# Patient Record
Sex: Male | Born: 1950 | Race: White | Hispanic: No | Marital: Married | Smoking: Former smoker
Health system: Southern US, Community
[De-identification: ages and names within clinical notes are randomized; demographics above are authoritative.]

## PROBLEM LIST (undated history)

## (undated) DIAGNOSIS — F321 Major depressive disorder, single episode, moderate: Secondary | ICD-10-CM

## (undated) DIAGNOSIS — E785 Hyperlipidemia, unspecified: Secondary | ICD-10-CM

## (undated) DIAGNOSIS — F32A Depression, unspecified: Secondary | ICD-10-CM

## (undated) DIAGNOSIS — K219 Gastro-esophageal reflux disease without esophagitis: Secondary | ICD-10-CM

## (undated) DIAGNOSIS — F329 Major depressive disorder, single episode, unspecified: Secondary | ICD-10-CM

## (undated) HISTORY — DX: Hyperlipidemia, unspecified: E78.5

## (undated) HISTORY — DX: Depression, unspecified: F32.A

## (undated) HISTORY — PX: ANKLE SURGERY: SHX546

## (undated) HISTORY — DX: Major depressive disorder, single episode, moderate: F32.1

## (undated) HISTORY — DX: Gastro-esophageal reflux disease without esophagitis: K21.9

---
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
## 2015-07-05 DIAGNOSIS — M25562 Pain in left knee: Secondary | ICD-10-CM | POA: Diagnosis not present

## 2015-07-05 DIAGNOSIS — M1712 Unilateral primary osteoarthritis, left knee: Secondary | ICD-10-CM | POA: Diagnosis not present

## 2015-08-08 DIAGNOSIS — F331 Major depressive disorder, recurrent, moderate: Secondary | ICD-10-CM | POA: Diagnosis not present

## 2015-08-08 DIAGNOSIS — K219 Gastro-esophageal reflux disease without esophagitis: Secondary | ICD-10-CM | POA: Diagnosis not present

## 2015-08-08 DIAGNOSIS — E782 Mixed hyperlipidemia: Secondary | ICD-10-CM | POA: Diagnosis not present

## 2015-08-08 DIAGNOSIS — K210 Gastro-esophageal reflux disease with esophagitis: Secondary | ICD-10-CM | POA: Diagnosis not present

## 2015-08-08 DIAGNOSIS — Z23 Encounter for immunization: Secondary | ICD-10-CM | POA: Diagnosis not present

## 2015-12-14 DIAGNOSIS — M79672 Pain in left foot: Secondary | ICD-10-CM | POA: Diagnosis not present

## 2016-01-04 DIAGNOSIS — M21962 Unspecified acquired deformity of left lower leg: Secondary | ICD-10-CM | POA: Diagnosis not present

## 2016-01-04 DIAGNOSIS — M79672 Pain in left foot: Secondary | ICD-10-CM | POA: Diagnosis not present

## 2016-01-04 DIAGNOSIS — M19072 Primary osteoarthritis, left ankle and foot: Secondary | ICD-10-CM | POA: Diagnosis not present

## 2016-02-08 DIAGNOSIS — M79672 Pain in left foot: Secondary | ICD-10-CM | POA: Diagnosis not present

## 2016-02-08 DIAGNOSIS — Z0000 Encounter for general adult medical examination without abnormal findings: Secondary | ICD-10-CM | POA: Diagnosis not present

## 2016-02-08 DIAGNOSIS — M21962 Unspecified acquired deformity of left lower leg: Secondary | ICD-10-CM | POA: Diagnosis not present

## 2016-02-22 DIAGNOSIS — M19072 Primary osteoarthritis, left ankle and foot: Secondary | ICD-10-CM | POA: Diagnosis not present

## 2016-02-22 DIAGNOSIS — M25472 Effusion, left ankle: Secondary | ICD-10-CM | POA: Diagnosis not present

## 2016-02-22 DIAGNOSIS — M25572 Pain in left ankle and joints of left foot: Secondary | ICD-10-CM | POA: Diagnosis not present

## 2016-02-22 DIAGNOSIS — M21962 Unspecified acquired deformity of left lower leg: Secondary | ICD-10-CM | POA: Diagnosis not present

## 2016-02-27 DIAGNOSIS — M19072 Primary osteoarthritis, left ankle and foot: Secondary | ICD-10-CM | POA: Diagnosis not present

## 2016-02-29 DIAGNOSIS — F3342 Major depressive disorder, recurrent, in full remission: Secondary | ICD-10-CM | POA: Diagnosis not present

## 2016-02-29 DIAGNOSIS — K210 Gastro-esophageal reflux disease with esophagitis: Secondary | ICD-10-CM | POA: Diagnosis not present

## 2016-02-29 DIAGNOSIS — K219 Gastro-esophageal reflux disease without esophagitis: Secondary | ICD-10-CM | POA: Diagnosis not present

## 2016-02-29 DIAGNOSIS — E782 Mixed hyperlipidemia: Secondary | ICD-10-CM | POA: Diagnosis not present

## 2016-07-04 DIAGNOSIS — M25511 Pain in right shoulder: Secondary | ICD-10-CM | POA: Diagnosis not present

## 2016-07-11 DIAGNOSIS — M19032 Primary osteoarthritis, left wrist: Secondary | ICD-10-CM | POA: Diagnosis not present

## 2016-07-11 DIAGNOSIS — M25542 Pain in joints of left hand: Secondary | ICD-10-CM | POA: Diagnosis not present

## 2016-07-11 DIAGNOSIS — M19031 Primary osteoarthritis, right wrist: Secondary | ICD-10-CM | POA: Diagnosis not present

## 2016-07-11 DIAGNOSIS — M25541 Pain in joints of right hand: Secondary | ICD-10-CM | POA: Diagnosis not present

## 2016-07-16 DIAGNOSIS — H6122 Impacted cerumen, left ear: Secondary | ICD-10-CM | POA: Diagnosis not present

## 2016-08-08 DIAGNOSIS — M19031 Primary osteoarthritis, right wrist: Secondary | ICD-10-CM | POA: Diagnosis not present

## 2016-08-08 DIAGNOSIS — M19032 Primary osteoarthritis, left wrist: Secondary | ICD-10-CM | POA: Diagnosis not present

## 2016-08-31 DIAGNOSIS — Z79899 Other long term (current) drug therapy: Secondary | ICD-10-CM | POA: Diagnosis not present

## 2016-08-31 DIAGNOSIS — Z23 Encounter for immunization: Secondary | ICD-10-CM | POA: Diagnosis not present

## 2016-08-31 DIAGNOSIS — E782 Mixed hyperlipidemia: Secondary | ICD-10-CM | POA: Diagnosis not present

## 2016-08-31 DIAGNOSIS — K210 Gastro-esophageal reflux disease with esophagitis: Secondary | ICD-10-CM | POA: Diagnosis not present

## 2016-08-31 DIAGNOSIS — F3342 Major depressive disorder, recurrent, in full remission: Secondary | ICD-10-CM | POA: Diagnosis not present

## 2016-08-31 DIAGNOSIS — Z125 Encounter for screening for malignant neoplasm of prostate: Secondary | ICD-10-CM | POA: Diagnosis not present

## 2016-09-27 DIAGNOSIS — Z1211 Encounter for screening for malignant neoplasm of colon: Secondary | ICD-10-CM | POA: Diagnosis not present

## 2016-09-27 DIAGNOSIS — E782 Mixed hyperlipidemia: Secondary | ICD-10-CM | POA: Diagnosis not present

## 2016-09-27 DIAGNOSIS — Z0000 Encounter for general adult medical examination without abnormal findings: Secondary | ICD-10-CM | POA: Diagnosis not present

## 2016-09-27 DIAGNOSIS — Z6831 Body mass index (BMI) 31.0-31.9, adult: Secondary | ICD-10-CM | POA: Diagnosis not present

## 2016-12-21 DIAGNOSIS — H6123 Impacted cerumen, bilateral: Secondary | ICD-10-CM | POA: Diagnosis not present

## 2016-12-21 DIAGNOSIS — H903 Sensorineural hearing loss, bilateral: Secondary | ICD-10-CM | POA: Diagnosis not present

## 2016-12-27 DIAGNOSIS — R001 Bradycardia, unspecified: Secondary | ICD-10-CM | POA: Diagnosis not present

## 2017-02-05 DIAGNOSIS — M19031 Primary osteoarthritis, right wrist: Secondary | ICD-10-CM | POA: Diagnosis not present

## 2017-02-05 DIAGNOSIS — M19032 Primary osteoarthritis, left wrist: Secondary | ICD-10-CM | POA: Diagnosis not present

## 2017-02-13 DIAGNOSIS — R001 Bradycardia, unspecified: Secondary | ICD-10-CM | POA: Diagnosis not present

## 2017-03-28 DIAGNOSIS — E782 Mixed hyperlipidemia: Secondary | ICD-10-CM | POA: Diagnosis not present

## 2017-03-28 DIAGNOSIS — M19071 Primary osteoarthritis, right ankle and foot: Secondary | ICD-10-CM | POA: Diagnosis not present

## 2017-03-28 DIAGNOSIS — K210 Gastro-esophageal reflux disease with esophagitis: Secondary | ICD-10-CM | POA: Diagnosis not present

## 2017-03-28 DIAGNOSIS — F3342 Major depressive disorder, recurrent, in full remission: Secondary | ICD-10-CM | POA: Diagnosis not present

## 2017-04-30 DIAGNOSIS — D6489 Other specified anemias: Secondary | ICD-10-CM | POA: Diagnosis not present

## 2017-04-30 DIAGNOSIS — R5383 Other fatigue: Secondary | ICD-10-CM | POA: Diagnosis not present

## 2017-05-14 ENCOUNTER — Other Ambulatory Visit: Payer: Self-pay

## 2017-06-25 DIAGNOSIS — H6502 Acute serous otitis media, left ear: Secondary | ICD-10-CM | POA: Diagnosis not present

## 2017-06-25 DIAGNOSIS — J060 Acute laryngopharyngitis: Secondary | ICD-10-CM | POA: Diagnosis not present

## 2017-09-23 DIAGNOSIS — K210 Gastro-esophageal reflux disease with esophagitis: Secondary | ICD-10-CM | POA: Diagnosis not present

## 2017-09-23 DIAGNOSIS — E782 Mixed hyperlipidemia: Secondary | ICD-10-CM | POA: Diagnosis not present

## 2017-09-23 DIAGNOSIS — Z23 Encounter for immunization: Secondary | ICD-10-CM | POA: Diagnosis not present

## 2017-09-23 DIAGNOSIS — F3342 Major depressive disorder, recurrent, in full remission: Secondary | ICD-10-CM | POA: Diagnosis not present

## 2018-02-13 DIAGNOSIS — L578 Other skin changes due to chronic exposure to nonionizing radiation: Secondary | ICD-10-CM | POA: Diagnosis not present

## 2018-02-13 DIAGNOSIS — D485 Neoplasm of uncertain behavior of skin: Secondary | ICD-10-CM | POA: Diagnosis not present

## 2018-02-13 DIAGNOSIS — L918 Other hypertrophic disorders of the skin: Secondary | ICD-10-CM | POA: Diagnosis not present

## 2018-02-13 DIAGNOSIS — L821 Other seborrheic keratosis: Secondary | ICD-10-CM | POA: Diagnosis not present

## 2018-03-24 DIAGNOSIS — Z1211 Encounter for screening for malignant neoplasm of colon: Secondary | ICD-10-CM | POA: Diagnosis not present

## 2018-03-24 DIAGNOSIS — F3342 Major depressive disorder, recurrent, in full remission: Secondary | ICD-10-CM | POA: Diagnosis not present

## 2018-03-24 DIAGNOSIS — Z6831 Body mass index (BMI) 31.0-31.9, adult: Secondary | ICD-10-CM | POA: Diagnosis not present

## 2018-03-24 DIAGNOSIS — K210 Gastro-esophageal reflux disease with esophagitis: Secondary | ICD-10-CM | POA: Diagnosis not present

## 2018-03-24 DIAGNOSIS — E782 Mixed hyperlipidemia: Secondary | ICD-10-CM | POA: Diagnosis not present

## 2018-03-31 DIAGNOSIS — Z1211 Encounter for screening for malignant neoplasm of colon: Secondary | ICD-10-CM | POA: Diagnosis not present

## 2018-03-31 LAB — COLOGUARD: Cologuard: NEGATIVE

## 2018-08-04 DIAGNOSIS — Z23 Encounter for immunization: Secondary | ICD-10-CM | POA: Diagnosis not present

## 2018-09-23 DIAGNOSIS — K210 Gastro-esophageal reflux disease with esophagitis: Secondary | ICD-10-CM | POA: Diagnosis not present

## 2018-09-23 DIAGNOSIS — E782 Mixed hyperlipidemia: Secondary | ICD-10-CM | POA: Diagnosis not present

## 2018-09-23 DIAGNOSIS — F321 Major depressive disorder, single episode, moderate: Secondary | ICD-10-CM | POA: Diagnosis not present

## 2019-04-28 DIAGNOSIS — Z0000 Encounter for general adult medical examination without abnormal findings: Secondary | ICD-10-CM | POA: Diagnosis not present

## 2019-04-28 DIAGNOSIS — F321 Major depressive disorder, single episode, moderate: Secondary | ICD-10-CM | POA: Diagnosis not present

## 2019-04-28 DIAGNOSIS — Z125 Encounter for screening for malignant neoplasm of prostate: Secondary | ICD-10-CM | POA: Diagnosis not present

## 2019-04-28 DIAGNOSIS — Z6832 Body mass index (BMI) 32.0-32.9, adult: Secondary | ICD-10-CM | POA: Diagnosis not present

## 2019-04-28 DIAGNOSIS — E782 Mixed hyperlipidemia: Secondary | ICD-10-CM | POA: Diagnosis not present

## 2019-04-28 DIAGNOSIS — K210 Gastro-esophageal reflux disease with esophagitis: Secondary | ICD-10-CM | POA: Diagnosis not present

## 2019-04-28 DIAGNOSIS — Z23 Encounter for immunization: Secondary | ICD-10-CM | POA: Diagnosis not present

## 2019-08-03 DIAGNOSIS — M25511 Pain in right shoulder: Secondary | ICD-10-CM | POA: Diagnosis not present

## 2019-08-03 DIAGNOSIS — M25512 Pain in left shoulder: Secondary | ICD-10-CM | POA: Diagnosis not present

## 2019-10-03 DIAGNOSIS — M791 Myalgia, unspecified site: Secondary | ICD-10-CM | POA: Diagnosis not present

## 2019-10-03 DIAGNOSIS — R0981 Nasal congestion: Secondary | ICD-10-CM | POA: Diagnosis not present

## 2019-10-03 DIAGNOSIS — Z20828 Contact with and (suspected) exposure to other viral communicable diseases: Secondary | ICD-10-CM | POA: Diagnosis not present

## 2019-12-31 ENCOUNTER — Encounter: Payer: Self-pay | Admitting: Physician Assistant

## 2019-12-31 ENCOUNTER — Other Ambulatory Visit: Payer: Self-pay

## 2019-12-31 ENCOUNTER — Ambulatory Visit (INDEPENDENT_AMBULATORY_CARE_PROVIDER_SITE_OTHER): Payer: Medicare Other | Admitting: Physician Assistant

## 2019-12-31 VITALS — BP 128/78 | HR 84 | Temp 97.3°F | Resp 16 | Wt 240.0 lb

## 2019-12-31 DIAGNOSIS — F419 Anxiety disorder, unspecified: Secondary | ICD-10-CM

## 2019-12-31 DIAGNOSIS — M25511 Pain in right shoulder: Secondary | ICD-10-CM

## 2019-12-31 DIAGNOSIS — M25512 Pain in left shoulder: Secondary | ICD-10-CM

## 2019-12-31 DIAGNOSIS — E782 Mixed hyperlipidemia: Secondary | ICD-10-CM | POA: Diagnosis not present

## 2019-12-31 DIAGNOSIS — G8929 Other chronic pain: Secondary | ICD-10-CM

## 2019-12-31 DIAGNOSIS — K219 Gastro-esophageal reflux disease without esophagitis: Secondary | ICD-10-CM | POA: Diagnosis not present

## 2019-12-31 MED ORDER — ATORVASTATIN CALCIUM 40 MG PO TABS: 40.0000 mg | ORAL_TABLET | Freq: Every day | ORAL | 1 refills | Status: DC

## 2019-12-31 MED ORDER — MELOXICAM 7.5 MG PO TABS: 7.5000 mg | ORAL_TABLET | Freq: Every day | ORAL | 1 refills | Status: DC

## 2019-12-31 MED ORDER — OMEPRAZOLE 40 MG PO CPDR: 40.0000 mg | DELAYED_RELEASE_CAPSULE | Freq: Every day | ORAL | 1 refills | Status: DC

## 2019-12-31 MED ORDER — PREDNISONE 20 MG PO TABS: ORAL_TABLET | ORAL | 0 refills | Status: DC

## 2019-12-31 MED ORDER — SERTRALINE HCL 100 MG PO TABS: 100.0000 mg | ORAL_TABLET | Freq: Every day | ORAL | 1 refills | Status: DC

## 2019-12-31 NOTE — Progress Notes (Signed)
Established Patient Office Visit  Subjective:  Patient ID: Angel Orr, male    DOB: [birth year]  Age: 69 y.o. MRN: [id number]  CC:  Chief Complaint  Patient presents with  . Hyperlipidemia  . Depression    HPI Angel Orr presents for hyperlipidemia and chronic problems  Mixed hyperlipidemia  Pt presents with hyperlipidemia.  Compliance with treatment has been good; The patient is compliant with medications, maintains a low cholesterol diet , follows up as directed , and maintains an exercise regimen . The patient denies experiencing any hypercholesterolemia related symptoms.  Evidenced based information based on history , exam, and other sources has been used  for decision making. Pt taking lipitor '40mg'$  qd  Pt with history of mild depression and anxiety - is currently on zoloft '100mg'$  qd - states med working well for him  GERD: Paitent complains of heartburn. States he is taking omeprazole '40mg'$  qd and this controls his symptoms - voices no other concerns with stomach  Pt states that he has a history of chronic shoulder pain - every once in awhile he gets to a point where he has tightness in his shoulders and not able to extend arms over head - when his symptoms get to this point he takes prednisone along with his meloxicam and it improves symptoms - requests today for prednisone     Past Medical History:  Diagnosis Date  . Chronic GERD   . Depression   . Hyperlipemia     Past Surgical History:  Procedure Laterality Date  . ANKLE SURGERY      Family History  Problem Relation Age of Onset  . CVA Mother   . CAD Father     Social History   Socioeconomic History  . Marital status: Married    Spouse name: Not on file  . Number of children: Not on file  . Years of education: Not on file  . Highest education level: Not on file  Occupational History  . Occupation: truck Geophysicist/field seismologist  Tobacco Use  . Smoking status: Never Smoker  . Smokeless tobacco: Never Used   Substance and Sexual Activity  . Alcohol use: Never  . Drug use: Never  . Sexual activity: Not on file  Other Topics Concern  . Not on file  Social History Narrative  . Not on file   Social Determinants of Health   Financial Resource Strain:   . Difficulty of Paying Living Expenses:   Food Insecurity:   . Worried About Charity fundraiser in the Last Year:   . Arboriculturist in the Last Year:   Transportation Needs:   . Film/video editor (Medical):   Marland Kitchen Lack of Transportation (Non-Medical):   Physical Activity:   . Days of Exercise per Week:   . Minutes of Exercise per Session:   Stress:   . Feeling of Stress :   Social Connections:   . Frequency of Communication with Friends and Family:   . Frequency of Social Gatherings with Friends and Family:   . Attends Religious Services:   . Active Member of Clubs or Organizations:   . Attends Archivist Meetings:   Marland Kitchen Marital Status:   Intimate Partner Violence:   . Fear of Current or Ex-Partner:   . Emotionally Abused:   Marland Kitchen Physically Abused:   . Sexually Abused:      Current Outpatient Medications:  .  aspirin 81 MG EC tablet, Take by mouth., Disp: , Rfl:  .  atorvastatin (LIPITOR) 40 MG tablet, Take 1 tablet (40 mg total) by mouth daily., Disp: 90 tablet, Rfl: 1 .  meloxicam (MOBIC) 7.5 MG tablet, Take 1 tablet (7.5 mg total) by mouth daily., Disp: 90 tablet, Rfl: 1 .  Multiple Vitamins-Minerals (MULTI COMPLETE PO), Take by mouth., Disp: , Rfl:  .  Omega-3 Fatty Acids (FISH OIL) 1000 MG CAPS, Take by mouth., Disp: , Rfl:  .  omeprazole (PRILOSEC) 40 MG capsule, Take 1 capsule (40 mg total) by mouth daily., Disp: 90 capsule, Rfl: 1 .  predniSONE (DELTASONE) 20 MG tablet, 1 po tid for 3 days 1 po bid for 3 days 1 po qd for 3 days, Disp: 18 tablet, Rfl: 0 .  sertraline (ZOLOFT) 100 MG tablet, Take 1 tablet (100 mg total) by mouth daily., Disp: 90 tablet, Rfl: 1   Allergies  Allergen Reactions  . Codeine   .  Morphine     ROS CONSTITUTIONAL: Negative for chills, fatigue, fever, unintentional weight gain and unintentional weight loss.  E/N/T: Negative for ear pain, nasal congestion and sore throat.  CARDIOVASCULAR: Negative for chest pain, dizziness, palpitations and pedal edema.  RESPIRATORY: Negative for recent cough and dyspnea.  GASTROINTESTINAL: Negative for abdominal pain, acid reflux symptoms, constipation, diarrhea, nausea and vomiting.  MSK: see HPI INTEGUMENTARY: Negative for rash.  NEUROLOGICAL: Negative for dizziness and headaches.  PSYCHIATRIC: Negative for sleep disturbance and to question depression screen.  Negative for depression, negative for anhedonia.        Objective:    PHYSICAL EXAM:   VS: BP 128/78   Pulse 84   Temp (!) 97.3 F (36.3 C)   Resp 16   Wt 240 lb (108.9 kg)   SpO2 96%   GEN: Well nourished, well developed, in no acute distress   Cardiac: RRR; no murmurs, rubs, or gallops,no edema - no significant varicosities Respiratory:  normal respiratory rate and pattern with no distress - normal breath sounds with no rales, rhonchi, wheezes or rubs GI: normal bowel sounds, no masses or tenderness MS: no deformity or atrophy - limited rom of both arms - decreased extension Skin: warm and dry, no rash  Neuro:  Alert and Oriented x 3, Strength and sensation are intact - CN II-Xii grossly intact Psych: euthymic mood, appropriate affect and demeanor  BP 128/78   Pulse 84   Temp (!) 97.3 F (36.3 C)   Resp 16   Wt 240 lb (108.9 kg)   SpO2 96%  Wt Readings from Last 3 Encounters:  12/31/19 240 lb (108.9 kg)     Health Maintenance Due  Topic Date Due  . Hepatitis C Screening  Never done  . TETANUS/TDAP  Never done  . PNA vac Low Risk Adult (1 of 2 - PCV13) Never done    There are no preventive care reminders to display for this patient.  No results found for: TSH No results found for: WBC, HGB, HCT, MCV, PLT No results found for: NA, K, CHLORIDE,  CO2, GLUCOSE, BUN, CREATININE, BILITOT, ALKPHOS, AST, ALT, PROT, ALBUMIN, CALCIUM, ANIONGAP, EGFR, GFR No results found for: CHOL No results found for: HDL No results found for: LDLCALC No results found for: TRIG No results found for: CHOLHDL No results found for: HGBA1C    Assessment & Plan:   Problem List Items Addressed This Visit      Digestive   Gastroesophageal reflux disease without esophagitis - Primary    Continue omeprazole as directed  Relevant Medications   omeprazole (PRILOSEC) 40 MG capsule   Other Relevant Orders   CBC with Differential/Platelet   Comprehensive metabolic panel     Other   Anxiety    Continue zoloft as directed      Relevant Medications   sertraline (ZOLOFT) 100 MG tablet   Mixed hyperlipidemia    Well controlled.  No changes to medicines.  Continue to work on eating a healthy diet and exercise.  Labs drawn today.        Relevant Medications   aspirin 81 MG EC tablet   atorvastatin (LIPITOR) 40 MG tablet   Other Relevant Orders   CBC with Differential/Platelet   Comprehensive metabolic panel   Lipid panel   Shoulder pain, bilateral    rx for prednisone         Meds ordered this encounter  Medications  . atorvastatin (LIPITOR) 40 MG tablet    Sig: Take 1 tablet (40 mg total) by mouth daily.    Dispense:  90 tablet    Refill:  1    Order Specific Question:   Supervising Provider    AnswerRochel Brome [id number]  . meloxicam (MOBIC) 7.5 MG tablet    Sig: Take 1 tablet (7.5 mg total) by mouth daily.    Dispense:  90 tablet    Refill:  1    Order Specific Question:   Supervising Provider    AnswerRochel Brome [id number]  . omeprazole (PRILOSEC) 40 MG capsule    Sig: Take 1 capsule (40 mg total) by mouth daily.    Dispense:  90 capsule    Refill:  1    Order Specific Question:   Supervising Provider    AnswerRochel Brome [id number]  . sertraline (ZOLOFT) 100 MG tablet    Sig: Take 1 tablet (100 mg total) by  mouth daily.    Dispense:  90 tablet    Refill:  1    Order Specific Question:   Supervising Provider    AnswerRochel Brome [id number]  . predniSONE (DELTASONE) 20 MG tablet    Sig: 1 po tid for 3 days 1 po bid for 3 days 1 po qd for 3 days    Dispense:  18 tablet    Refill:  0    Order Specific Question:   Supervising Provider    AnswerShelton Silvas    Follow-up: Return in about 6 months (around 07/02/2020) for fasting physical.    SARA R Haniyah Maciolek, PA-C

## 2019-12-31 NOTE — Assessment & Plan Note (Signed)
Continue zoloft as directed

## 2019-12-31 NOTE — Assessment & Plan Note (Signed)
Well controlled.  ?No changes to medicines.  ?Continue to work on eating a healthy diet and exercise.  ?Labs drawn today.  ?

## 2019-12-31 NOTE — Assessment & Plan Note (Signed)
Continue omeprazole as directed

## 2019-12-31 NOTE — Assessment & Plan Note (Signed)
rx for prednisone

## 2020-01-01 LAB — CBC WITH DIFFERENTIAL/PLATELET
Basophils Absolute: 0.1 10*3/uL (ref 0.0–0.2)
Basos: 1 %
EOS (ABSOLUTE): 0.3 10*3/uL (ref 0.0–0.4)
Eos: 5 %
Hematocrit: 38 % (ref 37.5–51.0)
Hemoglobin: 12.7 g/dL — ABNORMAL LOW (ref 13.0–17.7)
Immature Grans (Abs): 0 10*3/uL (ref 0.0–0.1)
Immature Granulocytes: 0 %
Lymphocytes Absolute: 1.9 10*3/uL (ref 0.7–3.1)
Lymphs: 35 %
MCH: 29.5 pg (ref 26.6–33.0)
MCHC: 33.4 g/dL (ref 31.5–35.7)
MCV: 88 fL (ref 79–97)
Monocytes Absolute: 0.5 10*3/uL (ref 0.1–0.9)
Monocytes: 10 %
Neutrophils Absolute: 2.7 10*3/uL (ref 1.4–7.0)
Neutrophils: 49 %
Platelets: 210 10*3/uL (ref 150–450)
RBC: 4.31 x10E6/uL (ref 4.14–5.80)
RDW: 13.2 % (ref 11.6–15.4)
WBC: 5.4 10*3/uL (ref 3.4–10.8)

## 2020-01-01 LAB — COMPREHENSIVE METABOLIC PANEL
ALT: 20 IU/L (ref 0–44)
AST: 19 IU/L (ref 0–40)
Albumin/Globulin Ratio: 1.5 (ref 1.2–2.2)
Albumin: 4 g/dL (ref 3.8–4.8)
Alkaline Phosphatase: 62 IU/L (ref 39–117)
BUN/Creatinine Ratio: 18 (ref 10–24)
BUN: 18 mg/dL (ref 8–27)
Bilirubin Total: 0.2 mg/dL (ref 0.0–1.2)
CO2: 20 mmol/L (ref 20–29)
Calcium: 9.1 mg/dL (ref 8.6–10.2)
Chloride: 107 mmol/L — ABNORMAL HIGH (ref 96–106)
Creatinine, Ser: 1 mg/dL (ref 0.76–1.27)
GFR calc Af Amer: 88 mL/min/{1.73_m2} (ref >59)
GFR calc non Af Amer: 76 mL/min/{1.73_m2} (ref >59)
Globulin, Total: 2.6 g/dL (ref 1.5–4.5)
Glucose: 95 mg/dL (ref 65–99)
Potassium: 4.5 mmol/L (ref 3.5–5.2)
Sodium: 143 mmol/L (ref 134–144)
Total Protein: 6.6 g/dL (ref 6.0–8.5)

## 2020-01-01 LAB — LIPID PANEL
Chol/HDL Ratio: 3 ratio (ref 0.0–5.0)
Cholesterol, Total: 203 mg/dL — ABNORMAL HIGH (ref 100–199)
HDL: 67 mg/dL (ref >39)
LDL Chol Calc (NIH): 114 mg/dL — ABNORMAL HIGH (ref 0–99)
Triglycerides: 124 mg/dL (ref 0–149)
VLDL Cholesterol Cal: 22 mg/dL (ref 5–40)

## 2020-01-01 LAB — CARDIOVASCULAR RISK ASSESSMENT

## 2020-01-25 ENCOUNTER — Ambulatory Visit (INDEPENDENT_AMBULATORY_CARE_PROVIDER_SITE_OTHER): Payer: Medicare Other | Admitting: Physician Assistant

## 2020-01-25 ENCOUNTER — Other Ambulatory Visit: Payer: Self-pay

## 2020-01-25 ENCOUNTER — Encounter: Payer: Self-pay | Admitting: Physician Assistant

## 2020-01-25 VITALS — BP 130/84 | HR 68 | Temp 97.5°F | Ht 71.0 in | Wt 246.0 lb

## 2020-01-25 DIAGNOSIS — F321 Major depressive disorder, single episode, moderate: Secondary | ICD-10-CM | POA: Insufficient documentation

## 2020-01-25 DIAGNOSIS — R040 Epistaxis: Secondary | ICD-10-CM | POA: Insufficient documentation

## 2020-01-25 MED ORDER — MUPIROCIN 2 % EX OINT: 1.0000 "application " | TOPICAL_OINTMENT | Freq: Two times a day (BID) | CUTANEOUS | 0 refills | Status: DC

## 2020-01-25 NOTE — Progress Notes (Signed)
Acute Office Visit  Subjective:    Patient ID: Angel Orr, male    DOB: 1950/12/16, 69 y.o.   MRN: [id number]  Chief Complaint  Patient presents with  . Nose Bleeds    HPI Patient is in today for nosebleed Pt states that he has a nosebleed from his left nostril about once every 3-4 months which has been going on 'awhile' Today he had one that lasted about 10 minutes - states they do get worse if he picks at his nose which he tries not to do  Past Medical History:  Diagnosis Date  . Chronic GERD   . Depression   . Hyperlipemia   . Major depressive disorder, single episode, moderate ([place])     Past Surgical History:  Procedure Laterality Date  . ANKLE SURGERY      Family History  Problem Relation Age of Onset  . CVA Mother   . CAD Mother   . CAD Father   . Cancer Sister   . CAD Sister     Social History   Socioeconomic History  . Marital status: Married    Spouse name: Not on file  . Number of children: Not on file  . Years of education: Not on file  . Highest education level: Not on file  Occupational History  . Occupation: truck Geophysicist/field seismologist  Tobacco Use  . Smoking status: Never Smoker  . Smokeless tobacco: Never Used  Substance and Sexual Activity  . Alcohol use: Never  . Drug use: Never  . Sexual activity: Not on file  Other Topics Concern  . Not on file  Social History Narrative  . Not on file   Social Determinants of Health   Financial Resource Strain:   . Difficulty of Paying Living Expenses:   Food Insecurity:   . Worried About Charity fundraiser in the Last Year:   . Arboriculturist in the Last Year:   Transportation Needs:   . Film/video editor (Medical):   Marland Kitchen Lack of Transportation (Non-Medical):   Physical Activity:   . Days of Exercise per Week:   . Minutes of Exercise per Session:   Stress:   . Feeling of Stress :   Social Connections:   . Frequency of Communication with Friends and Family:   . Frequency of Social Gatherings  with Friends and Family:   . Attends Religious Services:   . Active Member of Clubs or Organizations:   . Attends Archivist Meetings:   Marland Kitchen Marital Status:   Intimate Partner Violence:   . Fear of Current or Ex-Partner:   . Emotionally Abused:   Marland Kitchen Physically Abused:   . Sexually Abused:      Current Outpatient Medications:  .  aspirin 81 MG EC tablet, Take by mouth., Disp: , Rfl:  .  atorvastatin (LIPITOR) 40 MG tablet, Take 1 tablet (40 mg total) by mouth daily., Disp: 90 tablet, Rfl: 1 .  meloxicam (MOBIC) 7.5 MG tablet, Take 1 tablet (7.5 mg total) by mouth daily., Disp: 90 tablet, Rfl: 1 .  Multiple Vitamins-Minerals (MULTI COMPLETE PO), Take by mouth., Disp: , Rfl:  .  Omega-3 Fatty Acids (FISH OIL) 1000 MG CAPS, Take by mouth., Disp: , Rfl:  .  omeprazole (PRILOSEC) 40 MG capsule, Take 1 capsule (40 mg total) by mouth daily., Disp: 90 capsule, Rfl: 1 .  sertraline (ZOLOFT) 100 MG tablet, Take 1 tablet (100 mg total) by mouth daily., Disp: 90 tablet, Rfl:  1 .  mupirocin ointment (BACTROBAN) 2 %, Place 1 application into the nose 2 (two) times daily., Disp: 22 g, Rfl: 0   Allergies  Allergen Reactions  . Aleve [Naproxen]   . Codeine   . Morphine     CONSTITUTIONAL: Negative for chills, fatigue, fever, unintentional weight gain and unintentional weight loss.  E/N/T: Negative for ear pain, nasal congestion and sore throat. - see HPI CARDIOVASCULAR: Negative for chest pain, dizziness, palpitations and pedal edema.  RESPIRATORY: Negative for recent cough and dyspnea.          Objective:    PHYSICAL EXAM:   VS: BP 130/84   Pulse 68   Temp (!) 97.5 F (36.4 C)   Ht 5\' 11"  (1.803 m)   Wt 246 lb (111.6 kg)   SpO2 96%   BMI 34.31 kg/m   GEN: Well nourished, well developed, in no acute distress  HEENT: right nares normal - left nares with ulcerated area mid septum --- silver nitrate used to cauterize Pharynx normal  Cardiac: RRR; no murmurs, rubs, or  gallops,no edema - no significant varicosities Respiratory:  normal respiratory rate and pattern with no distress - normal breath sounds with no rales, rhonchi, wheezes or rubs  Psych: euthymic mood, appropriate affect and demeanor   Wt Readings from Last 3 Encounters:  01/25/20 246 lb (111.6 kg)  12/31/19 240 lb (108.9 kg)    Health Maintenance Due  Topic Date Due  . Hepatitis C Screening  Never done  . TETANUS/TDAP  Never done  . PNA vac Low Risk Adult (1 of 2 - PCV13) Never done    There are no preventive care reminders to display for this patient.        Assessment & Plan:   Problem List Items Addressed This Visit    None       Meds ordered this encounter  Medications  . mupirocin ointment (BACTROBAN) 2 %    Sig: Place 1 application into the nose 2 (two) times daily.    Dispense:  22 g    Refill:  0    Order Specific Question:   Supervising Provider    Answer:   COX, KIRSTEN [id number]     [place], PA-C

## 2020-01-25 NOTE — Assessment & Plan Note (Signed)
bactroban bid for one week Saline spray Follow up to recheck in one month

## 2020-02-24 ENCOUNTER — Ambulatory Visit (INDEPENDENT_AMBULATORY_CARE_PROVIDER_SITE_OTHER): Payer: Medicare Other | Admitting: Physician Assistant

## 2020-02-24 ENCOUNTER — Encounter: Payer: Self-pay | Admitting: Physician Assistant

## 2020-02-24 ENCOUNTER — Other Ambulatory Visit: Payer: Self-pay

## 2020-02-24 VITALS — BP 132/74 | HR 86 | Temp 97.7°F | Resp 16 | Wt 240.0 lb

## 2020-02-24 DIAGNOSIS — R040 Epistaxis: Secondary | ICD-10-CM

## 2020-02-24 NOTE — Progress Notes (Signed)
Acute Office Visit  Subjective:    Patient ID: Angel Orr, male    DOB: Mar 16, 1951, 69 y.o.   MRN: [id number]  Chief Complaint  Patient presents with  . Follow up epistaxis  .     HPI Patient is in today for nosebleed follow up Pt states that he has a nosebleed from his left nostril about once every 3-4 months which has been going on 'awhile' one month ago he came in and had an area treated with silver nitrate - is here for recheck States he has not had any further nosebleeds since that time  Past Medical History:  Diagnosis Date  . Chronic GERD   . Depression   . Hyperlipemia   . Major depressive disorder, single episode, moderate ([place])     Past Surgical History:  Procedure Laterality Date  . ANKLE SURGERY      Family History  Problem Relation Age of Onset  . CVA Mother   . CAD Mother   . CAD Father   . Cancer Sister   . CAD Sister     Social History   Socioeconomic History  . Marital status: Married    Spouse name: Not on file  . Number of children: Not on file  . Years of education: Not on file  . Highest education level: Not on file  Occupational History  . Occupation: truck Geophysicist/field seismologist  Tobacco Use  . Smoking status: Never Smoker  . Smokeless tobacco: Never Used  Substance and Sexual Activity  . Alcohol use: Never  . Drug use: Never  . Sexual activity: Not on file  Other Topics Concern  . Not on file  Social History Narrative  . Not on file   Social Determinants of Health   Financial Resource Strain:   . Difficulty of Paying Living Expenses:   Food Insecurity:   . Worried About Charity fundraiser in the Last Year:   . Arboriculturist in the Last Year:   Transportation Needs:   . Film/video editor (Medical):   Marland Kitchen Lack of Transportation (Non-Medical):   Physical Activity:   . Days of Exercise per Week:   . Minutes of Exercise per Session:   Stress:   . Feeling of Stress :   Social Connections:   . Frequency of Communication with  Friends and Family:   . Frequency of Social Gatherings with Friends and Family:   . Attends Religious Services:   . Active Member of Clubs or Organizations:   . Attends Archivist Meetings:   Marland Kitchen Marital Status:   Intimate Partner Violence:   . Fear of Current or Ex-Partner:   . Emotionally Abused:   Marland Kitchen Physically Abused:   . Sexually Abused:      Current Outpatient Medications:  .  aspirin 81 MG EC tablet, Take by mouth., Disp: , Rfl:  .  atorvastatin (LIPITOR) 40 MG tablet, Take 1 tablet (40 mg total) by mouth daily., Disp: 90 tablet, Rfl: 1 .  meloxicam (MOBIC) 7.5 MG tablet, Take 1 tablet (7.5 mg total) by mouth daily., Disp: 90 tablet, Rfl: 1 .  Multiple Vitamins-Minerals (MULTI COMPLETE PO), Take by mouth., Disp: , Rfl:  .  mupirocin ointment (BACTROBAN) 2 %, Place 1 application into the nose 2 (two) times daily., Disp: 22 g, Rfl: 0 .  Omega-3 Fatty Acids (FISH OIL) 1000 MG CAPS, Take by mouth., Disp: , Rfl:  .  omeprazole (PRILOSEC) 40 MG capsule, Take 1 capsule (  40 mg total) by mouth daily., Disp: 90 capsule, Rfl: 1 .  sertraline (ZOLOFT) 100 MG tablet, Take 1 tablet (100 mg total) by mouth daily., Disp: 90 tablet, Rfl: 1   Allergies  Allergen Reactions  . Aleve [Naproxen]   . Codeine   . Morphine     CONSTITUTIONAL: Negative for chills, fatigue, fever, unintentional weight gain and unintentional weight loss.  E/N/T: Negative for ear pain, nasal congestion and sore throat. -  CARDIOVASCULAR: Negative for chest pain, dizziness, palpitations and pedal edema.  RESPIRATORY: Negative for recent cough and dyspnea.          Objective:    PHYSICAL EXAM:   VS: BP 132/74   Pulse 86   Temp 97.7 F (36.5 C)   Resp 16   Wt 240 lb (108.9 kg)   SpO2 96%   BMI 33.47 kg/m   GEN: Well nourished, well developed, in no acute distress  HEENT: both nares normal - area has healed completely from before - no lesions noted in either nostril Pharynx normal  Cardiac: RRR;  no murmurs, rubs, or gallops,no edema - no significant varicosities Respiratory:  normal respiratory rate and pattern with no distress - normal breath sounds with no rales, rhonchi, wheezes or rubs     Wt Readings from Last 3 Encounters:  02/24/20 240 lb (108.9 kg)  01/25/20 246 lb (111.6 kg)  12/31/19 240 lb (108.9 kg)    Health Maintenance Due  Topic Date Due  . Hepatitis C Screening  Never done  . COVID-19 Vaccine (1) Never done  . TETANUS/TDAP  Never done  . PNA vac Low Risk Adult (1 of 2 - PCV13) Never done    There are no preventive care reminders to display for this patient.        Assessment & Plan:   Problem List Items Addressed This Visit      Other   Epistaxis - Primary       No orders of the defined types were placed in this encounter.    SARA R Reedy Biernat, PA-C

## 2020-02-24 NOTE — Assessment & Plan Note (Signed)
Continue to use saline as needed Follow up if symptoms recur

## 2020-06-28 ENCOUNTER — Other Ambulatory Visit: Payer: Self-pay | Admitting: Physician Assistant

## 2020-07-04 ENCOUNTER — Other Ambulatory Visit: Payer: Self-pay

## 2020-07-04 ENCOUNTER — Ambulatory Visit (INDEPENDENT_AMBULATORY_CARE_PROVIDER_SITE_OTHER): Payer: Medicare Other | Admitting: Physician Assistant

## 2020-07-04 ENCOUNTER — Encounter: Payer: Self-pay | Admitting: Physician Assistant

## 2020-07-04 VITALS — BP 130/76 | HR 81 | Temp 97.5°F | Ht 71.0 in | Wt 235.8 lb

## 2020-07-04 DIAGNOSIS — Z87898 Personal history of other specified conditions: Secondary | ICD-10-CM | POA: Diagnosis not present

## 2020-07-04 DIAGNOSIS — Z0000 Encounter for general adult medical examination without abnormal findings: Secondary | ICD-10-CM

## 2020-07-04 DIAGNOSIS — R3912 Poor urinary stream: Secondary | ICD-10-CM | POA: Diagnosis not present

## 2020-07-04 DIAGNOSIS — E782 Mixed hyperlipidemia: Secondary | ICD-10-CM | POA: Diagnosis not present

## 2020-07-04 DIAGNOSIS — Z125 Encounter for screening for malignant neoplasm of prostate: Secondary | ICD-10-CM | POA: Diagnosis not present

## 2020-07-04 MED ORDER — OMEPRAZOLE 40 MG PO CPDR: 40.0000 mg | DELAYED_RELEASE_CAPSULE | Freq: Every day | ORAL | 1 refills | Status: DC

## 2020-07-04 MED ORDER — MELOXICAM 7.5 MG PO TABS: 7.5000 mg | ORAL_TABLET | Freq: Every day | ORAL | 1 refills | Status: DC

## 2020-07-04 MED ORDER — ATORVASTATIN CALCIUM 40 MG PO TABS: 40.0000 mg | ORAL_TABLET | Freq: Every day | ORAL | 1 refills | Status: DC

## 2020-07-04 MED ORDER — SERTRALINE HCL 100 MG PO TABS: 100.0000 mg | ORAL_TABLET | Freq: Every day | ORAL | 1 refills | Status: DC

## 2020-07-04 NOTE — Assessment & Plan Note (Signed)
PSA pending

## 2020-07-04 NOTE — Assessment & Plan Note (Signed)
Well controlled.  ?No changes to medicines.  ?Continue to work on eating a healthy diet and exercise.  ?Labs drawn today.  ?

## 2020-07-04 NOTE — Patient Instructions (Signed)
Preventive Care 70 Years and Older, Male Preventive care refers to lifestyle choices and visits with your health care provider that can promote health and wellness. This includes:  A yearly physical exam. This is also called an annual well check.  Regular dental and eye exams.  Immunizations.  Screening for certain conditions.  Healthy lifestyle choices, such as diet and exercise. What can I expect for my preventive care visit? Physical exam Your health care provider will check:  Height and weight. These may be used to calculate body mass index (BMI), which is a measurement that tells if you are at a healthy weight.  Heart rate and blood pressure.  Your skin for abnormal spots. Counseling Your health care provider may ask you questions about:  Alcohol, tobacco, and drug use.  Emotional well-being.  Home and relationship well-being.  Sexual activity.  Eating habits.  History of falls.  Memory and ability to understand (cognition).  Work and work environment. What immunizations do I need?  Influenza (flu) vaccine  This is recommended every year. Tetanus, diphtheria, and pertussis (Tdap) vaccine  You may need a Td booster every 10 years. Varicella (chickenpox) vaccine  You may need this vaccine if you have not already been vaccinated. Zoster (shingles) vaccine  You may need this after age 70. Pneumococcal conjugate (PCV13) vaccine  One dose is recommended after age 70. Pneumococcal polysaccharide (PPSV23) vaccine  One dose is recommended after age 70. Measles, mumps, and rubella (MMR) vaccine  You may need at least one dose of MMR if you were born in 1957 or later. You may also need a second dose. Meningococcal conjugate (MenACWY) vaccine  You may need this if you have certain conditions. Hepatitis A vaccine  You may need this if you have certain conditions or if you travel or work in places where you may be exposed to hepatitis A. Hepatitis B  vaccine  You may need this if you have certain conditions or if you travel or work in places where you may be exposed to hepatitis B. Haemophilus influenzae type b (Hib) vaccine  You may need this if you have certain conditions. You may receive vaccines as individual doses or as more than one vaccine together in one shot (combination vaccines). Talk with your health care provider about the risks and benefits of combination vaccines. What tests do I need? Blood tests  Lipid and cholesterol levels. These may be checked every 5 years, or more frequently depending on your overall health.  Hepatitis C test.  Hepatitis B test. Screening  Lung cancer screening. You may have this screening every year starting at age 70 if you have a 30-pack-year history of smoking and currently smoke or have quit within the past 15 years.  Colorectal cancer screening. All adults should have this screening starting at age 70 and continuing until age 75. Your health care provider may recommend screening at age 70 if you are at increased risk. You will have tests every 1-10 years, depending on your results and the type of screening test.  Prostate cancer screening. Recommendations will vary depending on your family history and other risks.  Diabetes screening. This is done by checking your blood sugar (glucose) after you have not eaten for a while (fasting). You may have this done every 1-3 years.  Abdominal aortic aneurysm (AAA) screening. You may need this if you are a current or former smoker.  Sexually transmitted disease (STD) testing. Follow these instructions at home: Eating and drinking  Eat   a diet that includes fresh fruits and vegetables, whole grains, lean protein, and low-fat dairy products. Limit your intake of foods with high amounts of sugar, saturated fats, and salt.  Take vitamin and mineral supplements as recommended by your health care provider.  Do not drink alcohol if your health care  provider tells you not to drink.  If you drink alcohol: ? Limit how much you have to 0-2 drinks a day. ? Be aware of how much alcohol is in your drink. In the [place] one drink equals one 12 oz bottle of beer (355 mL), one 5 oz glass of wine (148 mL), or one 1 oz glass of hard liquor (44 mL). Lifestyle  Take daily care of your teeth and gums.  Stay active. Exercise for at least 30 minutes on 5 or more days each week.  Do not use any products that contain nicotine or tobacco, such as cigarettes, e-cigarettes, and chewing tobacco. If you need help quitting, ask your health care provider.  If you are sexually active, practice safe sex. Use a condom or other form of protection to prevent STIs (sexually transmitted infections).  Talk with your health care provider about taking a low-dose aspirin or statin. What's next?  Visit your health care provider once a year for a well check visit.  Ask your health care provider how often you should have your eyes and teeth checked.  Stay up to date on all vaccines. This information is not intended to replace advice given to you by your health care provider. Make sure you discuss any questions you have with your health care provider. Document Revised: 10/02/2018 Document Reviewed: 10/02/2018 Elsevier Patient Education  2020 [employer].

## 2020-07-04 NOTE — Assessment & Plan Note (Signed)
Continue healthy lifestyle Wellness handout given

## 2020-07-04 NOTE — Progress Notes (Signed)
Subjective:  Patient ID: Angel Orr, male    DOB: 06/13/1951  Age: 69 y.o. MRN: [id number]  Chief Complaint  Patient presents with   Annual Exam    Medicare AWV    HPI Encounter for general adult medical examination without abnormal findings  Physical ("At Risk" items are starred): Patient's last physical exam was 1 year ago .   SDOH Screenings   Alcohol Screen:    Last Alcohol Screening Score (AUDIT): Not on file  Depression (PHQ2-9): Low Risk    PHQ-2 Score: 0  Financial Resource Strain:    Difficulty of Paying Living Expenses: Not on file  Food Insecurity:    Worried About Charity fundraiser in the Last Year: Not on file   Ran Out of Food in the Last Year: Not on file  Housing:    Last Housing Risk Score: Not on file  Physical Activity:    Days of Exercise per Week: Not on file   Minutes of Exercise per Session: Not on file  Social Connections:    Frequency of Communication with Friends and Family: Not on file   Frequency of Social Gatherings with Friends and Family: Not on file   Attends Religious Services: Not on file   Active Member of Clubs or Organizations: Not on file   Attends Archivist Meetings: Not on file   Marital Status: Not on file  Stress:    Feeling of Stress : Not on file  Tobacco Use: Low Risk    Smoking Tobacco Use: Never Smoker   Smokeless Tobacco Use: Never Used  Transportation Needs:    Film/video editor (Medical): Not on file   Lack of Transportation (Non-Medical): Not on file    Fall Risk  07/04/2020 05/14/2017  Falls in the past year? 0 No  Comment - Emmi Telephone Survey: data to providers prior to load  Number falls in past yr: 0 -  Injury with Fall? 0 -    Depression screen PHQ 2/9 07/04/2020  Decreased Interest 0  Down, Depressed, Hopeless 0  PHQ - 2 Score 0    Functional Status Survey: Is the patient deaf or have difficulty hearing?: No Does the patient have difficulty seeing, even when  wearing glasses/contacts?: No Does the patient have difficulty concentrating, remembering, or making decisions?: No Does the patient have difficulty walking or climbing stairs?: No Does the patient have difficulty dressing or bathing?: No Does the patient have difficulty doing errands alone such as visiting a doctor's office or shopping?: No  Dental Care: biannual cleanings, brushes and flosses daily. Ophthalmology/Optometry: Annual visit.  Hearing loss: none Vision impairments: none Safety: reviewed ;  Patient wears a seat belt. Patient's home has smoke detectors and carbon monoxide detectors. Patient practices appropriate gun safety Patient wears sunscreen with extended sun exposure. Dental Care: biannual cleanings, brushes and flosses daily. Ophthalmology/Optometry: Annual visit.  Hearing loss: hearing aids Vision impairments: none  Patient is not afflicted from Stress Incontinence and Urge Incontinence - occasional nocturia  Current Outpatient Medications on File Prior to Visit  Medication Sig Dispense Refill   aspirin 81 MG EC tablet Take by mouth.     Multiple Vitamins-Minerals (MULTI COMPLETE PO) Take by mouth.     Omega-3 Fatty Acids (FISH OIL) 1000 MG CAPS Take by mouth.     No current facility-administered medications on file prior to visit.    Social Hx   Social History   Socioeconomic History   Marital status: Married  Spouse name: Not on file   Number of children: Not on file   Years of education: Not on file   Highest education level: Not on file  Occupational History   Occupation: truck driver  Tobacco Use   Smoking status: Never Smoker   Smokeless tobacco: Never Used  Scientific laboratory technician Use: Never used  Substance and Sexual Activity   Alcohol use: Never   Drug use: Never   Sexual activity: Not on file  Other Topics Concern   Not on file  Social History Narrative   Not on file   Social Determinants of Health   Financial  Resource Strain:    Difficulty of Paying Living Expenses: Not on file  Food Insecurity:    Worried About [place] in the Last Year: Not on file   [place] in the Last Year: Not on file  Transportation Needs:    Lack of Transportation (Medical): Not on file   Lack of Transportation (Non-Medical): Not on file  Physical Activity:    Days of Exercise per Week: Not on file   Minutes of Exercise per Session: Not on file  Stress:    Feeling of Stress : Not on file  Social Connections:    Frequency of Communication with Friends and Family: Not on file   Frequency of Social Gatherings with Friends and Family: Not on file   Attends Religious Services: Not on file   Active Member of Clubs or Organizations: Not on file   Attends Archivist Meetings: Not on file   Marital Status: Not on file   Past Medical History:  Diagnosis Date   Chronic GERD    Depression    Hyperlipemia    Major depressive disorder, single episode, moderate ([place])    Family History  Problem Relation Age of Onset   CVA Mother    CAD Mother    CAD Father    Cancer Sister    CAD Sister     Review of Systems  CONSTITUTIONAL: Negative for chills, fatigue, fever, unintentional weight gain and unintentional weight loss.  E/N/T: Negative for ear pain, nasal congestion and sore throat.  CARDIOVASCULAR: Negative for chest pain, dizziness, palpitations and pedal edema.  RESPIRATORY: Negative for recent cough and dyspnea.  GASTROINTESTINAL: Negative for abdominal pain, acid reflux symptoms, constipation, diarrhea, nausea and vomiting.  MSK: Negative for arthralgias and myalgias.  INTEGUMENTARY: Negative for rash.  NEUROLOGICAL: Negative for dizziness and headaches.  PSYCHIATRIC: Negative for sleep disturbance and to question depression screen.  Negative for depression, negative for anhedonia.      Objective:  BP 130/76 (BP Location: Left Arm, Patient Position: Sitting)     Pulse 81    Temp (!) 97.5 F (36.4 C) (Temporal)    Ht 5\' 11"  (1.803 m)    Wt 235 lb 12.8 oz (107 kg)    SpO2 97%    BMI 32.89 kg/m   BP/Weight 07/04/2020 02/22/6643 0/12/4740  Systolic BP 595 638 756  Diastolic BP 76 74 84  Wt. (Lbs) 235.8 240 246  BMI 32.89 33.47 34.31    Physical Exam PHYSICAL EXAM:   VS: BP 130/76 (BP Location: Left Arm, Patient Position: Sitting)    Pulse 81    Temp (!) 97.5 F (36.4 C) (Temporal)    Ht 5\' 11"  (1.803 m)    Wt 235 lb 12.8 oz (107 kg)    SpO2 97%    BMI 32.89 kg/m  GEN: Well nourished, well developed, in no acute distress  Cardiac: RRR; no murmurs, rubs, or gallops,no edema - no significant varicosities Respiratory:  normal respiratory rate and pattern with no distress - normal breath sounds with no rales, rhonchi, wheezes or rubs  Psych: euthymic mood, appropriate affect and demeanor  Lab Results  Component Value Date   WBC 5.4 12/31/2019   HGB 12.7 (L) 12/31/2019   HCT 38.0 12/31/2019   PLT 210 12/31/2019   GLUCOSE 95 12/31/2019   CHOL 203 (H) 12/31/2019   TRIG 124 12/31/2019   HDL 67 12/31/2019   LDLCALC 114 (H) 12/31/2019   ALT 20 12/31/2019   AST 19 12/31/2019   NA 143 12/31/2019   K 4.5 12/31/2019   CL 107 (H) 12/31/2019   CREATININE 1.00 12/31/2019   BUN 18 12/31/2019   CO2 20 12/31/2019      Assessment & Plan:  1. Medicare annual wellness visit, subsequent  2. Mixed hyperlipidemia - CBC with Differential/Platelet - Comprehensive metabolic panel - Lipid panel  3. Prostate cancer screening - PSA  4. History of nocturia - PSA    Meds ordered this encounter  Medications   atorvastatin (LIPITOR) 40 MG tablet    Sig: Take 1 tablet (40 mg total) by mouth daily.    Dispense:  90 tablet    Refill:  1    Order Specific Question:   Supervising Provider    Answer:   Shelton Silvas   meloxicam (MOBIC) 7.5 MG tablet    Sig: Take 1 tablet (7.5 mg total) by mouth daily.    Dispense:  90 tablet    Refill:  1      Order Specific Question:   Supervising Provider    Answer:   Shelton Silvas   omeprazole (PRILOSEC) 40 MG capsule    Sig: Take 1 capsule (40 mg total) by mouth daily.    Dispense:  90 capsule    Refill:  1    Order Specific Question:   Supervising Provider    Answer:   Shelton Silvas   sertraline (ZOLOFT) 100 MG tablet    Sig: Take 1 tablet (100 mg total) by mouth daily.    Dispense:  90 tablet    Refill:  1    Order Specific Question:   Supervising Provider    AnswerShelton Silvas    These are the goals we discussed: Goals   None      This is a list of the screening recommended for you and due dates:  Health Maintenance  Topic Date Due    Hepatitis C: One time screening is recommended by Center for Disease Control  (CDC) for  adults born from [birth year] through 1966.   Never done   COVID-19 Vaccine (1) Never done   Tetanus Vaccine  Never done   Pneumonia vaccines (1 of 2 - PCV13) Never done   Flu Shot  05/22/2020   Cologuard (Stool DNA test)  03/31/2021      AN INDIVIDUALIZED CARE PLAN: was established or reinforced today.   SELF MANAGEMENT: The patient and I together assessed ways to personally work towards obtaining the recommended goals  Support needs The patient and/or family needs were assessed and services were offered and not necessary at this time.    Follow-up: Return in about 6 months (around 01/01/2021) for chronic fasting follow up. [place] [phone]

## 2020-07-05 LAB — CBC WITH DIFFERENTIAL/PLATELET
Basophils Absolute: 0.1 10*3/uL (ref 0.0–0.2)
Basos: 1 %
EOS (ABSOLUTE): 0.3 10*3/uL (ref 0.0–0.4)
Eos: 5 %
Hematocrit: 35.9 % — ABNORMAL LOW (ref 37.5–51.0)
Hemoglobin: 11.9 g/dL — ABNORMAL LOW (ref 13.0–17.7)
Immature Grans (Abs): 0 10*3/uL (ref 0.0–0.1)
Immature Granulocytes: 0 %
Lymphocytes Absolute: 1.7 10*3/uL (ref 0.7–3.1)
Lymphs: 28 %
MCH: 29.2 pg (ref 26.6–33.0)
MCHC: 33.1 g/dL (ref 31.5–35.7)
MCV: 88 fL (ref 79–97)
Monocytes Absolute: 0.6 10*3/uL (ref 0.1–0.9)
Monocytes: 10 %
Neutrophils Absolute: 3.3 10*3/uL (ref 1.4–7.0)
Neutrophils: 56 %
Platelets: 238 10*3/uL (ref 150–450)
RBC: 4.08 x10E6/uL — ABNORMAL LOW (ref 4.14–5.80)
RDW: 13.3 % (ref 11.6–15.4)
WBC: 5.9 10*3/uL (ref 3.4–10.8)

## 2020-07-05 LAB — COMPREHENSIVE METABOLIC PANEL
ALT: 19 IU/L (ref 0–44)
AST: 15 IU/L (ref 0–40)
Albumin/Globulin Ratio: 1.8 (ref 1.2–2.2)
Albumin: 4.2 g/dL (ref 3.8–4.8)
Alkaline Phosphatase: 78 IU/L (ref 44–121)
BUN/Creatinine Ratio: 18 (ref 10–24)
BUN: 17 mg/dL (ref 8–27)
Bilirubin Total: <0.2 mg/dL (ref 0.0–1.2)
CO2: 20 mmol/L (ref 20–29)
Calcium: 9.1 mg/dL (ref 8.6–10.2)
Chloride: 107 mmol/L — ABNORMAL HIGH (ref 96–106)
Creatinine, Ser: 0.94 mg/dL (ref 0.76–1.27)
GFR calc Af Amer: 95 mL/min/{1.73_m2} (ref >59)
GFR calc non Af Amer: 82 mL/min/{1.73_m2} (ref >59)
Globulin, Total: 2.4 g/dL (ref 1.5–4.5)
Glucose: 83 mg/dL (ref 65–99)
Potassium: 4.5 mmol/L (ref 3.5–5.2)
Sodium: 143 mmol/L (ref 134–144)
Total Protein: 6.6 g/dL (ref 6.0–8.5)

## 2020-07-05 LAB — LIPID PANEL
Chol/HDL Ratio: 3.3 ratio (ref 0.0–5.0)
Cholesterol, Total: 201 mg/dL — ABNORMAL HIGH (ref 100–199)
HDL: 61 mg/dL (ref >39)
LDL Chol Calc (NIH): 99 mg/dL (ref 0–99)
Triglycerides: 241 mg/dL — ABNORMAL HIGH (ref 0–149)
VLDL Cholesterol Cal: 41 mg/dL — ABNORMAL HIGH (ref 5–40)

## 2020-07-05 LAB — CARDIOVASCULAR RISK ASSESSMENT

## 2020-07-05 LAB — PSA: Prostate Specific Ag, Serum: <0.1 ng/mL (ref 0.0–4.0)

## 2020-08-02 ENCOUNTER — Other Ambulatory Visit: Payer: Medicare Other

## 2020-08-02 ENCOUNTER — Other Ambulatory Visit: Payer: Self-pay

## 2020-08-02 DIAGNOSIS — D508 Other iron deficiency anemias: Secondary | ICD-10-CM | POA: Diagnosis not present

## 2020-08-02 LAB — CBC
Hematocrit: 36 % — ABNORMAL LOW (ref 37.5–51.0)
Hemoglobin: 11.7 g/dL — ABNORMAL LOW (ref 13.0–17.7)
MCH: 28.6 pg (ref 26.6–33.0)
MCHC: 32.5 g/dL (ref 31.5–35.7)
MCV: 88 fL (ref 79–97)
Platelets: 238 10*3/uL (ref 150–450)
RBC: 4.09 x10E6/uL — ABNORMAL LOW (ref 4.14–5.80)
RDW: 13.1 % (ref 11.6–15.4)
WBC: 6.1 10*3/uL (ref 3.4–10.8)

## 2020-10-05 ENCOUNTER — Encounter: Payer: Self-pay | Admitting: Physician Assistant

## 2020-10-05 ENCOUNTER — Other Ambulatory Visit: Payer: Self-pay

## 2020-10-05 ENCOUNTER — Ambulatory Visit (INDEPENDENT_AMBULATORY_CARE_PROVIDER_SITE_OTHER): Payer: Medicare Other | Admitting: Physician Assistant

## 2020-10-05 VITALS — BP 126/72 | HR 76 | Temp 97.2°F | Ht 71.0 in | Wt 238.0 lb

## 2020-10-05 DIAGNOSIS — K148 Other diseases of tongue: Secondary | ICD-10-CM

## 2020-10-05 NOTE — Progress Notes (Signed)
Acute Office Visit  Subjective:    Patient ID: Angel Orr, male    DOB: [birth year], 69 y.o.   MRN: [id number]  Chief Complaint  Patient presents with  . Spot on tongue    HPI Patient is in today for lesion on tongue - pt states that he noticed it about 2 months ago - describes as raised white patch - does not bother him - does have a history of cigarettes but quit in 2003 but now does dip tobacco regularly  Past Medical History:  Diagnosis Date  . Chronic GERD   . Depression   . Hyperlipemia   . Major depressive disorder, single episode, moderate ([place])     Past Surgical History:  Procedure Laterality Date  . ANKLE SURGERY      Family History  Problem Relation Age of Onset  . CVA Mother   . CAD Mother   . CAD Father   . Cancer Sister   . CAD Sister     Social History   Socioeconomic History  . Marital status: Married    Spouse name: Not on file  . Number of children: Not on file  . Years of education: Not on file  . Highest education level: Not on file  Occupational History  . Occupation: truck Geophysicist/field seismologist  Tobacco Use  . Smoking status: Never Smoker  . Smokeless tobacco: Never Used  Vaping Use  . Vaping Use: Never used  Substance and Sexual Activity  . Alcohol use: Never  . Drug use: Never  . Sexual activity: Not on file  Other Topics Concern  . Not on file  Social History Narrative  . Not on file   Social Determinants of Health   Financial Resource Strain: Not on file  Food Insecurity: Not on file  Transportation Needs: Not on file  Physical Activity: Not on file  Stress: Not on file  Social Connections: Not on file  Intimate Partner Violence: Not on file    Outpatient Medications Prior to Visit  Medication Sig Dispense Refill  . aspirin 81 MG EC tablet Take by mouth.    Marland Kitchen atorvastatin (LIPITOR) 40 MG tablet Take 1 tablet (40 mg total) by mouth daily. 90 tablet 1  . meloxicam (MOBIC) 7.5 MG tablet Take 1 tablet (7.5 mg total) by mouth  daily. 90 tablet 1  . Multiple Vitamins-Minerals (MULTI COMPLETE PO) Take by mouth.    . Omega-3 Fatty Acids (FISH OIL) 1000 MG CAPS Take by mouth.    Marland Kitchen omeprazole (PRILOSEC) 40 MG capsule Take 1 capsule (40 mg total) by mouth daily. 90 capsule 1  . sertraline (ZOLOFT) 100 MG tablet Take 1 tablet (100 mg total) by mouth daily. 90 tablet 1   No facility-administered medications prior to visit.    Allergies  Allergen Reactions  . Aleve [Naproxen]   . Codeine   . Morphine     Review of Systems CONSTITUTIONAL: Negative for chills, fatigue, fever, unintentional weight gain and unintentional weight loss.  E/N/T: see HPI CARDIOVASCULAR: Negative for chest pain, dizziness, palpitations and pedal edema.  RESPIRATORY: Negative for recent cough and dyspnea.         Objective:    Physical Exam PHYSICAL EXAM:   VS: BP 126/72 (BP Location: Right Arm, Patient Position: Sitting, Cuff Size: Large)   Pulse 76   Temp (!) 97.2 F (36.2 C) (Temporal)   Ht 5\' 11"  (1.803 m)   Wt 238 lb (108 kg)   SpO2 99%  BMI 33.19 kg/m   GEN: Well nourished, well developed, in no acute distress  Oropharynx - no erythema - tongue has raised white plaque on right side Cardiac: RRR; no murmurs, rubs, or gallops,no edema -  Respiratory:  normal respiratory rate and pattern with no distress - normal breath sounds with no rales, rhonchi, wheezes or rubs   BP 126/72 (BP Location: Right Arm, Patient Position: Sitting, Cuff Size: Large)   Pulse 76   Temp (!) 97.2 F (36.2 C) (Temporal)   Ht 5\' 11"  (1.803 m)   Wt 238 lb (108 kg)   SpO2 99%   BMI 33.19 kg/m  Wt Readings from Last 3 Encounters:  10/05/20 238 lb (108 kg)  07/04/20 235 lb 12.8 oz (107 kg)  02/24/20 240 lb (108.9 kg)    There are no preventive care reminders to display for this patient.  There are no preventive care reminders to display for this patient.   No results found for: TSH Lab Results  Component Value Date   WBC 6.1  08/02/2020   HGB 11.7 (L) 08/02/2020   HCT 36.0 (L) 08/02/2020   MCV 88 08/02/2020   PLT 238 08/02/2020   Lab Results  Component Value Date   NA 143 07/04/2020   K 4.5 07/04/2020   CO2 20 07/04/2020   GLUCOSE 83 07/04/2020   BUN 17 07/04/2020   CREATININE 0.94 07/04/2020   BILITOT <0.2 07/04/2020   ALKPHOS 78 07/04/2020   AST 15 07/04/2020   ALT 19 07/04/2020   PROT 6.6 07/04/2020   ALBUMIN 4.2 07/04/2020   CALCIUM 9.1 07/04/2020   Lab Results  Component Value Date   CHOL 201 (H) 07/04/2020   Lab Results  Component Value Date   HDL 61 07/04/2020   Lab Results  Component Value Date   LDLCALC 99 07/04/2020   Lab Results  Component Value Date   TRIG 241 (H) 07/04/2020   Lab Results  Component Value Date   CHOLHDL 3.3 07/04/2020   No results found for: HGBA1C     Assessment & Plan:  1. Tongue lesion - Ambulatory referral to ENT    No orders of the defined types were placed in this encounter.   Orders Placed This Encounter  Procedures  . Ambulatory referral to ENT       Follow-up: No follow-ups on file.  An After Visit Summary was printed and given to the patient.  Yetta Flock [facility] [phone]

## 2020-11-14 DIAGNOSIS — Z974 Presence of external hearing-aid: Secondary | ICD-10-CM | POA: Diagnosis not present

## 2020-11-14 DIAGNOSIS — J342 Deviated nasal septum: Secondary | ICD-10-CM | POA: Diagnosis not present

## 2020-11-14 DIAGNOSIS — K089 Disorder of teeth and supporting structures, unspecified: Secondary | ICD-10-CM | POA: Diagnosis not present

## 2020-11-14 DIAGNOSIS — J310 Chronic rhinitis: Secondary | ICD-10-CM | POA: Diagnosis not present

## 2020-11-14 DIAGNOSIS — Z720 Tobacco use: Secondary | ICD-10-CM | POA: Diagnosis not present

## 2020-11-14 DIAGNOSIS — J3489 Other specified disorders of nose and nasal sinuses: Secondary | ICD-10-CM | POA: Diagnosis not present

## 2020-11-14 DIAGNOSIS — H9193 Unspecified hearing loss, bilateral: Secondary | ICD-10-CM | POA: Diagnosis not present

## 2020-11-14 DIAGNOSIS — K1321 Leukoplakia of oral mucosa, including tongue: Secondary | ICD-10-CM | POA: Diagnosis not present

## 2020-11-23 ENCOUNTER — Encounter: Payer: Self-pay | Admitting: Physician Assistant

## 2021-01-09 ENCOUNTER — Ambulatory Visit (INDEPENDENT_AMBULATORY_CARE_PROVIDER_SITE_OTHER): Payer: Medicare Other | Admitting: Physician Assistant

## 2021-01-09 ENCOUNTER — Other Ambulatory Visit: Payer: Self-pay

## 2021-01-09 ENCOUNTER — Encounter: Payer: Self-pay | Admitting: Physician Assistant

## 2021-01-09 VITALS — BP 122/76 | HR 75 | Temp 96.4°F | Ht 71.0 in | Wt 241.6 lb

## 2021-01-09 DIAGNOSIS — K219 Gastro-esophageal reflux disease without esophagitis: Secondary | ICD-10-CM | POA: Diagnosis not present

## 2021-01-09 DIAGNOSIS — F419 Anxiety disorder, unspecified: Secondary | ICD-10-CM

## 2021-01-09 DIAGNOSIS — Z1211 Encounter for screening for malignant neoplasm of colon: Secondary | ICD-10-CM

## 2021-01-09 DIAGNOSIS — E782 Mixed hyperlipidemia: Secondary | ICD-10-CM

## 2021-01-09 MED ORDER — SERTRALINE HCL 100 MG PO TABS: 100.0000 mg | ORAL_TABLET | Freq: Every day | ORAL | 1 refills | Status: DC

## 2021-01-09 MED ORDER — MELOXICAM 7.5 MG PO TABS: 7.5000 mg | ORAL_TABLET | Freq: Every day | ORAL | 1 refills | Status: DC

## 2021-01-09 MED ORDER — ATORVASTATIN CALCIUM 40 MG PO TABS
40.0000 mg | ORAL_TABLET | Freq: Every day | ORAL | 1 refills | Status: DC
Start: 2021-01-09 — End: 2021-07-05

## 2021-01-09 MED ORDER — OMEPRAZOLE 40 MG PO CPDR
40.0000 mg | DELAYED_RELEASE_CAPSULE | Freq: Every day | ORAL | 1 refills | Status: DC
Start: 2021-01-09 — End: 2021-07-05

## 2021-01-09 NOTE — Progress Notes (Signed)
Established Patient Office Visit  Subjective:  Patient ID: Angel Orr, male    DOB: January 18, 1951  Age: 70 y.o. MRN: [id number]  CC:  Chief Complaint  Patient presents with  . Hyperlipidemia  . Depression    HPI Catcher Dehoyos presents for hyperlipidemia and chronic problems  Mixed hyperlipidemia  Pt presents with hyperlipidemia.  Compliance with treatment has been good; The patient is compliant with medications, maintains a low cholesterol diet , follows up as directed , . The patient denies experiencing any hypercholesterolemia related symptoms.  Pt taking lipitor 40mg  qd  Pt with history of mild depression and anxiety - is currently on zoloft 100mg  qd - states med working well for him  GERD: Paitent complains of heartburn. States he is taking omeprazole 40mg  qd and this controls his symptoms - voices no other concerns with stomach  Pt takes meloxicam only as needed for intermittent knee pain - voices no other concerns today    Past Medical History:  Diagnosis Date  . Chronic GERD   . Depression   . Hyperlipemia   . Major depressive disorder, single episode, moderate ([place])     Past Surgical History:  Procedure Laterality Date  . ANKLE SURGERY      Family History  Problem Relation Age of Onset  . CVA Mother   . CAD Mother   . CAD Father   . Cancer Sister   . CAD Sister     Social History   Socioeconomic History  . Marital status: Married    Spouse name: Not on file  . Number of children: Not on file  . Years of education: Not on file  . Highest education level: Not on file  Occupational History  . Occupation: truck Geophysicist/field seismologist  Tobacco Use  . Smoking status: Never Smoker  . Smokeless tobacco: Never Used  Vaping Use  . Vaping Use: Never used  Substance and Sexual Activity  . Alcohol use: Never  . Drug use: Never  . Sexual activity: Not on file  Other Topics Concern  . Not on file  Social History Narrative  . Not on file   Social Determinants of  Health   Financial Resource Strain: Not on file  Food Insecurity: Not on file  Transportation Needs: Not on file  Physical Activity: Not on file  Stress: Not on file  Social Connections: Not on file  Intimate Partner Violence: Not on file     Current Outpatient Medications:  .  aspirin 81 MG EC tablet, Take by mouth., Disp: , Rfl:  .  Multiple Vitamins-Minerals (MULTI COMPLETE PO), Take by mouth., Disp: , Rfl:  .  Omega-3 Fatty Acids (FISH OIL) 1000 MG CAPS, Take by mouth., Disp: , Rfl:  .  atorvastatin (LIPITOR) 40 MG tablet, Take 1 tablet (40 mg total) by mouth daily., Disp: 90 tablet, Rfl: 1 .  meloxicam (MOBIC) 7.5 MG tablet, Take 1 tablet (7.5 mg total) by mouth daily., Disp: 90 tablet, Rfl: 1 .  omeprazole (PRILOSEC) 40 MG capsule, Take 1 capsule (40 mg total) by mouth daily., Disp: 90 capsule, Rfl: 1 .  sertraline (ZOLOFT) 100 MG tablet, Take 1 tablet (100 mg total) by mouth daily., Disp: 90 tablet, Rfl: 1   Allergies  Allergen Reactions  . Aleve [Naproxen]   . Codeine   . Morphine     ROS CONSTITUTIONAL: Negative for chills, fatigue, fever, unintentional weight gain and unintentional weight loss.  E/N/T: Negative for ear pain, nasal congestion and sore throat.  CARDIOVASCULAR: Negative for chest pain, dizziness, palpitations and pedal edema.  RESPIRATORY: Negative for recent cough and dyspnea.  GASTROINTESTINAL: Negative for abdominal pain, acid reflux symptoms, constipation, diarrhea, nausea and vomiting.  MSK: Negative for arthralgias and myalgias.  INTEGUMENTARY: Negative for rash.  NEUROLOGICAL: Negative for dizziness and headaches.  PSYCHIATRIC: Negative for sleep disturbance and to question depression screen.  Negative for depression, negative for anhedonia.            Objective:    PHYSICAL EXAM:   VS: BP 122/76 (BP Location: Left Arm, Patient Position: Sitting, Cuff Size: Normal)   Pulse 75   Temp (!) 96.4 F (35.8 C) (Temporal)   Ht 5\' 11"  (1.803  m)   Wt 241 lb 9.6 oz (109.6 kg)   SpO2 96%   BMI 33.70 kg/m  PHYSICAL EXAM:   VS: BP 122/76 (BP Location: Left Arm, Patient Position: Sitting, Cuff Size: Normal)   Pulse 75   Temp (!) 96.4 F (35.8 C) (Temporal)   Ht 5\' 11"  (1.803 m)   Wt 241 lb 9.6 oz (109.6 kg)   SpO2 96%   BMI 33.70 kg/m   GEN: Well nourished, well developed, in no acute distress  Cardiac: RRR; no murmurs, rubs, or gallops,no edema - Respiratory:  normal respiratory rate and pattern with no distress - normal breath sounds with no rales, rhonchi, wheezes or rubs Skin: warm and dry, no rash  Neuro:  Alert and Oriented x 3, Strength and sensation are intact - CN II-Xii grossly intact Psych: euthymic mood, appropriate affect and demeanor     BP 122/76 (BP Location: Left Arm, Patient Position: Sitting, Cuff Size: Normal)   Pulse 75   Temp (!) 96.4 F (35.8 C) (Temporal)   Ht 5\' 11"  (1.803 m)   Wt 241 lb 9.6 oz (109.6 kg)   SpO2 96%   BMI 33.70 kg/m  Wt Readings from Last 3 Encounters:  01/09/21 241 lb 9.6 oz (109.6 kg)  10/05/20 238 lb (108 kg)  07/04/20 235 lb 12.8 oz (107 kg)     There are no preventive care reminders to display for this patient.  There are no preventive care reminders to display for this patient.  No results found for: TSH Lab Results  Component Value Date   WBC 6.1 08/02/2020   HGB 11.7 (L) 08/02/2020   HCT 36.0 (L) 08/02/2020   MCV 88 08/02/2020   PLT 238 08/02/2020   Lab Results  Component Value Date   NA 143 07/04/2020   K 4.5 07/04/2020   CO2 20 07/04/2020   GLUCOSE 83 07/04/2020   BUN 17 07/04/2020   CREATININE 0.94 07/04/2020   BILITOT <0.2 07/04/2020   ALKPHOS 78 07/04/2020   AST 15 07/04/2020   ALT 19 07/04/2020   PROT 6.6 07/04/2020   ALBUMIN 4.2 07/04/2020   CALCIUM 9.1 07/04/2020   Lab Results  Component Value Date   CHOL 201 (H) 07/04/2020   Lab Results  Component Value Date   HDL 61 07/04/2020   Lab Results  Component Value Date    LDLCALC 99 07/04/2020   Lab Results  Component Value Date   TRIG 241 (H) 07/04/2020   Lab Results  Component Value Date   CHOLHDL 3.3 07/04/2020   No results found for: HGBA1C    Assessment & Plan:   Problem List Items Addressed This Visit      Digestive   Gastroesophageal reflux disease without esophagitis   Relevant Medications   omeprazole (  PRILOSEC) 40 MG capsule     Other   Anxiety   Relevant Medications   sertraline (ZOLOFT) 100 MG tablet   Mixed hyperlipidemia   Relevant Medications   atorvastatin (LIPITOR) 40 MG tablet   Other Relevant Orders   CBC with Differential/Platelet   Comprehensive metabolic panel   Lipid panel    Other Visit Diagnoses    Screening for colon cancer    -  Primary   Relevant Orders   Ambulatory referral to Gastroenterology      Meds ordered this encounter  Medications  . atorvastatin (LIPITOR) 40 MG tablet    Sig: Take 1 tablet (40 mg total) by mouth daily.    Dispense:  90 tablet    Refill:  1    Order Specific Question:   Supervising Provider    AnswerRochel Brome [id number]  . meloxicam (MOBIC) 7.5 MG tablet    Sig: Take 1 tablet (7.5 mg total) by mouth daily.    Dispense:  90 tablet    Refill:  1    Order Specific Question:   Supervising Provider    AnswerRochel Brome [id number]  . omeprazole (PRILOSEC) 40 MG capsule    Sig: Take 1 capsule (40 mg total) by mouth daily.    Dispense:  90 capsule    Refill:  1    Order Specific Question:   Supervising Provider    AnswerRochel Brome [id number]  . sertraline (ZOLOFT) 100 MG tablet    Sig: Take 1 tablet (100 mg total) by mouth daily.    Dispense:  90 tablet    Refill:  1    Order Specific Question:   Supervising Provider    Answer:   Shelton Silvas    Follow-up: Return in about 6 months (around 07/12/2021) for chronic fasting.    SARA R Tea Collums, PA-C

## 2021-01-10 ENCOUNTER — Other Ambulatory Visit: Payer: Self-pay | Admitting: Physician Assistant

## 2021-01-10 LAB — COMPREHENSIVE METABOLIC PANEL
ALT: 23 IU/L (ref 0–44)
AST: 19 IU/L (ref 0–40)
Albumin/Globulin Ratio: 1.8 (ref 1.2–2.2)
Albumin: 4.2 g/dL (ref 3.8–4.8)
Alkaline Phosphatase: 91 IU/L (ref 44–121)
BUN/Creatinine Ratio: 16 (ref 10–24)
BUN: 14 mg/dL (ref 8–27)
Bilirubin Total: 0.2 mg/dL (ref 0.0–1.2)
CO2: 22 mmol/L (ref 20–29)
Calcium: 8.9 mg/dL (ref 8.6–10.2)
Chloride: 106 mmol/L (ref 96–106)
Creatinine, Ser: 0.87 mg/dL (ref 0.76–1.27)
Globulin, Total: 2.4 g/dL (ref 1.5–4.5)
Glucose: 96 mg/dL (ref 65–99)
Potassium: 4.6 mmol/L (ref 3.5–5.2)
Sodium: 144 mmol/L (ref 134–144)
Total Protein: 6.6 g/dL (ref 6.0–8.5)
eGFR: 93 mL/min/{1.73_m2} (ref >59)

## 2021-01-10 LAB — CBC WITH DIFFERENTIAL/PLATELET
Basophils Absolute: 0.1 10*3/uL (ref 0.0–0.2)
Basos: 1 %
EOS (ABSOLUTE): 0.3 10*3/uL (ref 0.0–0.4)
Eos: 5 %
Hematocrit: 38.9 % (ref 37.5–51.0)
Hemoglobin: 12.4 g/dL — ABNORMAL LOW (ref 13.0–17.7)
Immature Grans (Abs): 0 10*3/uL (ref 0.0–0.1)
Immature Granulocytes: 0 %
Lymphocytes Absolute: 1.9 10*3/uL (ref 0.7–3.1)
Lymphs: 31 %
MCH: 27.9 pg (ref 26.6–33.0)
MCHC: 31.9 g/dL (ref 31.5–35.7)
MCV: 87 fL (ref 79–97)
Monocytes Absolute: 0.6 10*3/uL (ref 0.1–0.9)
Monocytes: 9 %
Neutrophils Absolute: 3.3 10*3/uL (ref 1.4–7.0)
Neutrophils: 54 %
Platelets: 227 10*3/uL (ref 150–450)
RBC: 4.45 x10E6/uL (ref 4.14–5.80)
RDW: 13.6 % (ref 11.6–15.4)
WBC: 6 10*3/uL (ref 3.4–10.8)

## 2021-01-10 LAB — LIPID PANEL
Chol/HDL Ratio: 3.5 ratio (ref 0.0–5.0)
Cholesterol, Total: 202 mg/dL — ABNORMAL HIGH (ref 100–199)
HDL: 57 mg/dL (ref >39)
LDL Chol Calc (NIH): 103 mg/dL — ABNORMAL HIGH (ref 0–99)
Triglycerides: 250 mg/dL — ABNORMAL HIGH (ref 0–149)
VLDL Cholesterol Cal: 42 mg/dL — ABNORMAL HIGH (ref 5–40)

## 2021-01-10 LAB — CARDIOVASCULAR RISK ASSESSMENT

## 2021-01-23 ENCOUNTER — Ambulatory Visit (INDEPENDENT_AMBULATORY_CARE_PROVIDER_SITE_OTHER): Payer: Medicare Other | Admitting: Physician Assistant

## 2021-01-23 ENCOUNTER — Other Ambulatory Visit: Payer: Self-pay

## 2021-01-23 ENCOUNTER — Encounter: Payer: Self-pay | Admitting: Physician Assistant

## 2021-01-23 VITALS — BP 130/78 | HR 94 | Temp 97.0°F | Ht 71.0 in | Wt 240.0 lb

## 2021-01-23 DIAGNOSIS — J060 Acute laryngopharyngitis: Secondary | ICD-10-CM | POA: Insufficient documentation

## 2021-01-23 DIAGNOSIS — R040 Epistaxis: Secondary | ICD-10-CM | POA: Diagnosis not present

## 2021-01-23 MED ORDER — FLUTICASONE PROPIONATE 50 MCG/ACT NA SUSP: 2.0000 | Freq: Every day | NASAL | 6 refills | Status: DC

## 2021-01-23 MED ORDER — AMOXICILLIN 875 MG PO TABS: 875.0000 mg | ORAL_TABLET | Freq: Two times a day (BID) | ORAL | 0 refills | Status: DC

## 2021-01-23 NOTE — Progress Notes (Signed)
Acute Office Visit  Subjective:    Patient ID: Angel Orr, male    DOB: [birth year], 70 y.o.   MRN: [id number]  Chief Complaint  Patient presents with  . Epistaxis    HPI Patient is in today for congestion, allergies and nosebleeds Pt states that he is troubled with allergies -in the past week with congestion, cough, and drainage He started having trouble with nosebleeds also for the past week intermittently - he does have saline spray at home but not using regularly  Past Medical History:  Diagnosis Date  . Chronic GERD   . Depression   . Hyperlipemia   . Major depressive disorder, single episode, moderate ([place])     Past Surgical History:  Procedure Laterality Date  . ANKLE SURGERY      Family History  Problem Relation Age of Onset  . CVA Mother   . CAD Mother   . CAD Father   . Cancer Sister   . CAD Sister     Social History   Socioeconomic History  . Marital status: Married    Spouse name: Not on file  . Number of children: Not on file  . Years of education: Not on file  . Highest education level: Not on file  Occupational History  . Occupation: truck Geophysicist/field seismologist  Tobacco Use  . Smoking status: Never Smoker  . Smokeless tobacco: Never Used  Vaping Use  . Vaping Use: Never used  Substance and Sexual Activity  . Alcohol use: Never  . Drug use: Never  . Sexual activity: Not on file  Other Topics Concern  . Not on file  Social History Narrative  . Not on file   Social Determinants of Health   Financial Resource Strain: Not on file  Food Insecurity: Not on file  Transportation Needs: Not on file  Physical Activity: Not on file  Stress: Not on file  Social Connections: Not on file  Intimate Partner Violence: Not on file    Outpatient Medications Prior to Visit  Medication Sig Dispense Refill  . aspirin 81 MG EC tablet Take by mouth.    Marland Kitchen atorvastatin (LIPITOR) 40 MG tablet Take 1 tablet (40 mg total) by mouth daily. 90 tablet 1  . meloxicam  (MOBIC) 7.5 MG tablet Take 1 tablet (7.5 mg total) by mouth daily. 90 tablet 1  . Multiple Vitamins-Minerals (MULTI COMPLETE PO) Take by mouth.    . Omega-3 Fatty Acids (FISH OIL) 1000 MG CAPS Take by mouth.    Marland Kitchen omeprazole (PRILOSEC) 40 MG capsule Take 1 capsule (40 mg total) by mouth daily. 90 capsule 1  . sertraline (ZOLOFT) 100 MG tablet Take 1 tablet (100 mg total) by mouth daily. 90 tablet 1   No facility-administered medications prior to visit.    Allergies  Allergen Reactions  . Aleve [Naproxen]   . Codeine   . Morphine     Review of Systems CONSTITUTIONAL: Negative for chills, fatigue, fever, unintentional weight gain and unintentional weight loss.  E/N/T: see HPI CARDIOVASCULAR: Negative for chest pain, dizziness, palpitations and pedal edema.  RESPIRATORY: see HPI INTEGUMENTARY: Negative for rash.         Objective:    Physical Exam PHYSICAL EXAM:   VS: BP 130/78 (BP Location: Left Arm, Patient Position: Sitting, Cuff Size: Normal)   Pulse 94   Temp (!) 97 F (36.1 C) (Temporal)   Ht 5\' 11"  (1.803 m)   Wt 240 lb (108.9 kg)   SpO2 96%  BMI 33.47 kg/m   GEN: Well nourished, well developed, in no acute distress  HEENT: nares - right side mild inflammation - left side with moderate irritation and inflammation noted - no active bleeding Pharynx with erythema Cardiac: RRR; no murmurs, rubs, or gallops,no edema -  Respiratory:  normal respiratory rate and pattern with no distress - normal breath sounds with no rales, rhonchi, wheezes or rubs Psych: euthymic mood, appropriate affect and demeanor  BP 130/78 (BP Location: Left Arm, Patient Position: Sitting, Cuff Size: Normal)   Pulse 94   Temp (!) 97 F (36.1 C) (Temporal)   Ht 5\' 11"  (1.803 m)   Wt 240 lb (108.9 kg)   SpO2 96%   BMI 33.47 kg/m  Wt Readings from Last 3 Encounters:  01/23/21 240 lb (108.9 kg)  01/09/21 241 lb 9.6 oz (109.6 kg)  10/05/20 238 lb (108 kg)    There are no preventive care  reminders to display for this patient.  There are no preventive care reminders to display for this patient.   No results found for: TSH Lab Results  Component Value Date   WBC 6.0 01/09/2021   HGB 12.4 (L) 01/09/2021   HCT 38.9 01/09/2021   MCV 87 01/09/2021   PLT 227 01/09/2021   Lab Results  Component Value Date   NA 144 01/09/2021   K 4.6 01/09/2021   CO2 22 01/09/2021   GLUCOSE 96 01/09/2021   BUN 14 01/09/2021   CREATININE 0.87 01/09/2021   BILITOT 0.2 01/09/2021   ALKPHOS 91 01/09/2021   AST 19 01/09/2021   ALT 23 01/09/2021   PROT 6.6 01/09/2021   ALBUMIN 4.2 01/09/2021   CALCIUM 8.9 01/09/2021   Lab Results  Component Value Date   CHOL 202 (H) 01/09/2021   Lab Results  Component Value Date   HDL 57 01/09/2021   Lab Results  Component Value Date   LDLCALC 103 (H) 01/09/2021   Lab Results  Component Value Date   TRIG 250 (H) 01/09/2021   Lab Results  Component Value Date   CHOLHDL 3.5 01/09/2021   No results found for: HGBA1C     Assessment & Plan:  1. Acute laryngopharyngitis - amoxicillin (AMOXIL) 875 MG tablet; Take 1 tablet (875 mg total) by mouth 2 (two) times daily.  Dispense: 20 tablet; Refill: 0 - fluticasone (FLONASE) 50 MCG/ACT nasal spray; Place 2 sprays into both nostrils daily.  Dispense: 16 g; Refill: 6  2. Epistaxis  recommend otc saline spray - humidifier  Meds ordered this encounter  Medications  . amoxicillin (AMOXIL) 875 MG tablet    Sig: Take 1 tablet (875 mg total) by mouth 2 (two) times daily.    Dispense:  20 tablet    Refill:  0    Order Specific Question:   Supervising Provider    AnswerRochel Brome [id number]  . fluticasone (FLONASE) 50 MCG/ACT nasal spray    Sig: Place 2 sprays into both nostrils daily.    Dispense:  16 g    Refill:  6    Order Specific Question:   Supervising Provider    Answer:   Shelton Silvas    No orders of the defined types were placed in this encounter.     Follow-up:  Return if symptoms worsen or fail to improve.  An After Visit Summary was printed and given to the patient.  Yetta Flock [facility] [phone]

## 2021-02-10 DIAGNOSIS — H6122 Impacted cerumen, left ear: Secondary | ICD-10-CM | POA: Diagnosis not present

## 2021-02-10 DIAGNOSIS — Z8719 Personal history of other diseases of the digestive system: Secondary | ICD-10-CM | POA: Diagnosis not present

## 2021-02-10 DIAGNOSIS — J342 Deviated nasal septum: Secondary | ICD-10-CM | POA: Diagnosis not present

## 2021-02-10 DIAGNOSIS — Z720 Tobacco use: Secondary | ICD-10-CM | POA: Diagnosis not present

## 2021-02-10 DIAGNOSIS — K089 Disorder of teeth and supporting structures, unspecified: Secondary | ICD-10-CM | POA: Diagnosis not present

## 2021-02-27 ENCOUNTER — Telehealth (INDEPENDENT_AMBULATORY_CARE_PROVIDER_SITE_OTHER): Payer: Medicare Other | Admitting: Physician Assistant

## 2021-02-27 ENCOUNTER — Encounter: Payer: Self-pay | Admitting: Physician Assistant

## 2021-02-27 VITALS — Ht 71.0 in | Wt 240.0 lb

## 2021-02-27 DIAGNOSIS — J060 Acute laryngopharyngitis: Secondary | ICD-10-CM

## 2021-02-27 MED ORDER — CEFDINIR 300 MG PO CAPS: 300.0000 mg | ORAL_CAPSULE | Freq: Two times a day (BID) | ORAL | 0 refills | Status: DC

## 2021-02-27 MED ORDER — BENZONATATE 100 MG PO CAPS: 100.0000 mg | ORAL_CAPSULE | Freq: Two times a day (BID) | ORAL | 0 refills | Status: DC | PRN

## 2021-02-27 NOTE — Progress Notes (Signed)
Virtual Visit via Telephone Note   This visit type was conducted due to national recommendations for restrictions regarding the COVID-19 Pandemic (e.g. social distancing) in an effort to limit this patient's exposure and mitigate transmission in our community.  Due to his co-morbid illnesses, this patient is at least at moderate risk for complications without adequate follow up.  This format is felt to be most appropriate for this patient at this time.  The patient did not have access to video technology/had technical difficulties with video requiring transitioning to audio format only (telephone).  All issues noted in this document were discussed and addressed.  No physical exam could be performed with this format.  Patient verbally consented to a telehealth visit.   Date:  02/27/2021   ID:  [name], DOB [birth year], MRN [id number]  Patient Location: Home Provider Location: Office  PCP:  Marge Duncans, PA-C   Chief Complaint:  URI/cough  History of Present Illness:    Angel Orr is a 70 y.o. male with cough and congestion that started earlier today - states the cough is slightly productive and has sinus pressure and pnd Denies fever, malaise, headache  The patient does not have symptoms concerning for COVID-19 infection (fever, chills, cough, or new shortness of breath).    Past Medical History:  Diagnosis Date  . Chronic GERD   . Depression   . Hyperlipemia   . Major depressive disorder, single episode, moderate ([place])    Past Surgical History:  Procedure Laterality Date  . ANKLE SURGERY       Current Meds  Medication Sig  . aspirin 81 MG EC tablet Take by mouth.  Marland Kitchen atorvastatin (LIPITOR) 40 MG tablet Take 1 tablet (40 mg total) by mouth daily.  . benzonatate (TESSALON) 100 MG capsule Take 1 capsule (100 mg total) by mouth 2 (two) times daily as needed for cough.  . cefdinir (OMNICEF) 300 MG capsule Take 1 capsule (300 mg total) by mouth 2 (two) times daily.  .  fluticasone (FLONASE) 50 MCG/ACT nasal spray Place 2 sprays into both nostrils daily.  . meloxicam (MOBIC) 7.5 MG tablet Take 1 tablet (7.5 mg total) by mouth daily.  . Multiple Vitamins-Minerals (MULTI COMPLETE PO) Take by mouth.  . Omega-3 Fatty Acids (FISH OIL) 1000 MG CAPS Take by mouth.  Marland Kitchen omeprazole (PRILOSEC) 40 MG capsule Take 1 capsule (40 mg total) by mouth daily.  . sertraline (ZOLOFT) 100 MG tablet Take 1 tablet (100 mg total) by mouth daily.     Allergies:   Aleve [naproxen], Codeine, and Morphine   Social History   Tobacco Use  . Smoking status: Never Smoker  . Smokeless tobacco: Never Used  Vaping Use  . Vaping Use: Never used  Substance Use Topics  . Alcohol use: Never  . Drug use: Never     Family Hx: The patient's family history includes CAD in his father, mother, and sister; CVA in his mother; Cancer in his sister.  ROS:   Please see the history of present illness.    All other systems reviewed and are negative.  Labs/Other Tests and Data Reviewed:    Recent Labs: 01/09/2021: ALT 23; BUN 14; Creatinine, Ser 0.87; Hemoglobin 12.4; Platelets 227; Potassium 4.6; Sodium 144   Recent Lipid Panel Lab Results  Component Value Date/Time   CHOL 202 (H) 01/09/2021 09:44 AM   TRIG 250 (H) 01/09/2021 09:44 AM   HDL 57 01/09/2021 09:44 AM   CHOLHDL 3.5 01/09/2021 09:44 AM  [place] 103 (H) 01/09/2021 09:44 AM    Wt Readings from Last 3 Encounters:  02/27/21 240 lb (108.9 kg)  01/23/21 240 lb (108.9 kg)  01/09/21 241 lb 9.6 oz (109.6 kg)     Objective:    Vital Signs:  Ht 5\' 11"  (1.803 m)   Wt 240 lb (108.9 kg)   BMI 33.47 kg/m    VITAL SIGNS:  reviewed  ASSESSMENT & PLAN:    1. URI - rx for omnicef and tessalon sent in - follow up if any symptoms change or worsen  COVID-19 Education: The signs and symptoms of COVID-19 were discussed with the patient and how to seek care for testing (follow up with PCP or arrange E-visit). The importance of social  distancing was discussed today.  Time:   Today, I have spent 10  minutes with the patient with telehealth technology discussing the above problems.     Medication Adjustments/Labs and Tests Ordered: Current medicines are reviewed at length with the patient today.  Concerns regarding medicines are outlined above.   Tests Ordered: No orders of the defined types were placed in this encounter.   Medication Changes: Meds ordered this encounter  Medications  . cefdinir (OMNICEF) 300 MG capsule    Sig: Take 1 capsule (300 mg total) by mouth 2 (two) times daily.    Dispense:  20 capsule    Refill:  0    Order Specific Question:   Supervising Provider    AnswerRochel Brome [id number]  . benzonatate (TESSALON) 100 MG capsule    Sig: Take 1 capsule (100 mg total) by mouth 2 (two) times daily as needed for cough.    Dispense:  30 capsule    Refill:  0    Order Specific Question:   Supervising Provider    AnswerShelton Silvas    Follow Up:  In Person prn  Signed, Webb Silversmith, PA-C  02/27/2021 2:17 PM    [place]

## 2021-02-28 DIAGNOSIS — I10 Essential (primary) hypertension: Secondary | ICD-10-CM | POA: Diagnosis not present

## 2021-02-28 DIAGNOSIS — R059 Cough, unspecified: Secondary | ICD-10-CM | POA: Diagnosis not present

## 2021-02-28 DIAGNOSIS — K219 Gastro-esophageal reflux disease without esophagitis: Secondary | ICD-10-CM | POA: Diagnosis not present

## 2021-02-28 DIAGNOSIS — R110 Nausea: Secondary | ICD-10-CM | POA: Diagnosis not present

## 2021-02-28 DIAGNOSIS — R509 Fever, unspecified: Secondary | ICD-10-CM | POA: Diagnosis not present

## 2021-02-28 DIAGNOSIS — Z20822 Contact with and (suspected) exposure to covid-19: Secondary | ICD-10-CM | POA: Diagnosis not present

## 2021-02-28 DIAGNOSIS — R531 Weakness: Secondary | ICD-10-CM | POA: Diagnosis not present

## 2021-03-09 ENCOUNTER — Ambulatory Visit (INDEPENDENT_AMBULATORY_CARE_PROVIDER_SITE_OTHER): Payer: Medicare Other | Admitting: Nurse Practitioner

## 2021-03-09 ENCOUNTER — Encounter: Payer: Self-pay | Admitting: Nurse Practitioner

## 2021-03-09 VITALS — BP 102/62 | HR 84 | Temp 98.0°F | Ht 71.0 in | Wt 232.0 lb

## 2021-03-09 DIAGNOSIS — J301 Allergic rhinitis due to pollen: Secondary | ICD-10-CM

## 2021-03-09 DIAGNOSIS — R059 Cough, unspecified: Secondary | ICD-10-CM | POA: Diagnosis not present

## 2021-03-09 DIAGNOSIS — J40 Bronchitis, not specified as acute or chronic: Secondary | ICD-10-CM | POA: Diagnosis not present

## 2021-03-09 DIAGNOSIS — Z87891 Personal history of nicotine dependence: Secondary | ICD-10-CM | POA: Diagnosis not present

## 2021-03-09 LAB — POC COVID19 BINAXNOW: SARS Coronavirus 2 Ag: NEGATIVE

## 2021-03-09 MED ORDER — BREZTRI AEROSPHERE 160-9-4.8 MCG/ACT IN AERO: 2.0000 | INHALATION_SPRAY | Freq: Two times a day (BID) | RESPIRATORY_TRACT | 0 refills | Status: DC

## 2021-03-09 NOTE — Patient Instructions (Addendum)
Use Breztri inhaler once daily, rinse mouth out afterwards Begin Claritin 10 mg daily for allergies Begin Delsym cough syrup Remove laundry basket out of bedroom Follow-up if no improvement    Allergic Rhinitis, Adult Allergic rhinitis is a reaction to allergens. Allergens are things that can cause an allergic reaction. This condition affects the lining inside the nose (mucous membrane). There are two types of allergic rhinitis:  Seasonal. This type is also called hay fever. It happens only during some times of the year.  Perennial. This type can happen at any time of the year. This condition cannot be spread from person to person (is not contagious). It can be mild, worse, or very bad. It can develop at any age and may be outgrown. What are the causes? This condition may be caused by:  Pollen from grasses, trees, and weeds.  Dust mites.  Smoke.  Mold.  Car fumes.  The pee (urine), spit, or dander of pets. Dander is dead skin cells from a pet.   What increases the risk? You are more likely to develop this condition if:  You have allergies in your family.  You have problems like allergies in your family. You may have: ? Swelling of parts of your eyes and eyelids. ? Asthma. This affects how you breathe. ? Long-term redness and swelling on your skin. ? Food allergies. What are the signs or symptoms? The main symptom of this condition is a runny or stuffy nose (nasal congestion). Other symptoms may include:  Sneezing or coughing.  Itching and tearing of your eyes.  Mucus that drips down the back of your throat (postnasal drip).  Trouble sleeping.  Feeling tired.  Headache.  Sore throat. How is this treated? There is no cure for this condition. You should avoid things that you are allergic to. Treatment can help to relieve symptoms. This may include:  Medicines that block allergy symptoms, such as corticosteroids or antihistamines. These may be given as a shot,  nasal spray, or pill.  Avoiding things you are allergic to.  Medicines that give you bits of what you are allergic to over time. This is called immunotherapy. It is done if other treatments do not help. You may get: ? Shots. ? Medicine under your tongue.  Stronger medicines, if other treatments do not help. Follow these instructions at home: Avoiding allergens Find out what things you are allergic to and avoid them. To do this, try these things:  If you get allergies any time of year: ? Replace carpet with wood, tile, or vinyl flooring. Carpet can trap pet dander and dust. ? Do not smoke. Do not allow smoking in your home. ? Change your heating and air conditioning filters at least once a month.  If you get allergies only some times of the year: ? Keep windows closed when you can. ? Plan things to do outside when pollen counts are lowest. Check pollen counts before you plan things to do outside. ? When you come indoors, change your clothes and shower before you sit on furniture or bedding.   If you are allergic to a pet: ? Keep the pet out of your bedroom. ? Vacuum, sweep, and dust often.   General instructions  Take over-the-counter and prescription medicines only as told by your doctor.  Drink enough fluid to keep your pee (urine) pale yellow.  Keep all follow-up visits as told by your doctor. This is important. Where to find more information  American Academy of Allergy, Asthma &  Immunology: www.aaaai.org Contact a doctor if:  You have a fever.  You get a cough that does not go away.  You make whistling sounds when you breathe (wheeze).  Your symptoms slow you down.  Your symptoms stop you from doing your normal things each day. Get help right away if:  You are short of breath. This symptom may be an emergency. Do not wait to see if the symptom will go away. Get medical help right away. Call your local emergency services (911 in the [place] Do not drive yourself  to the hospital. Summary  Allergic rhinitis may be treated by taking medicines and avoiding things you are allergic to.  If you have allergies only some of the year, keep windows closed when you can at those times.  Contact your doctor if you get a fever or a cough that does not go away. This information is not intended to replace advice given to you by your health care provider. Make sure you discuss any questions you have with your health care provider. Document Revised: 11/30/2019 Document Reviewed: 10/06/2019 Elsevier Patient Education  2021 [place]. Acute Bronchitis, Adult  Acute bronchitis is when air tubes in the lungs (bronchi) suddenly get swollen. The condition can make it hard for you to breathe. In adults, acute bronchitis usually goes away within 2 weeks. A cough caused by bronchitis may last up to 3 weeks. Smoking, allergies, and asthma can make the condition worse. What are the causes? This condition is caused by:  Cold and flu viruses. The most common cause of this condition is the virus that causes the common cold.  Bacteria.  Substances that irritate the lungs, including: ? Smoke from cigarettes and other types of tobacco. ? Dust and pollen. ? Fumes from chemicals, gases, or burned fuel. ? Other materials that pollute indoor or outdoor air.  Close contact with someone who has acute bronchitis. What increases the risk? The following factors may make you more likely to develop this condition:  A weak body's defense system. This is also called the immune system.  Any condition that affects your lungs and breathing, such as asthma. What are the signs or symptoms? Symptoms of this condition include:  A cough.  Coughing up clear, yellow, or green mucus.  Wheezing.  Chest congestion.  Shortness of breath.  A fever.  Body aches.  Chills.  A sore throat. How is this treated? Acute bronchitis may go away over time without treatment. Your doctor may  recommend:  Drinking more fluids.  Taking a medicine for a fever or cough.  Using a device that gets medicine into your lungs (inhaler).  Using a vaporizer or a humidifier. These are machines that add water or moisture in the air to help with coughing and poor breathing. Follow these instructions at home: Activity  Get a lot of rest.  Avoid places where there are fumes from chemicals.  Return to your normal activities as told by your doctor. Ask your doctor what activities are safe for you. Lifestyle  Drink enough fluids to keep your pee (urine) pale yellow.  Do not drink alcohol.  Do not use any products that contain nicotine or tobacco, such as cigarettes, e-cigarettes, and chewing tobacco. If you need help quitting, ask your doctor. Be aware that: ? Your bronchitis will get worse if you smoke or breathe in other people's smoke (secondhand smoke). ? Your lungs will heal faster if you quit smoking. General instructions  Take over-the-counter and prescription medicines  only as told by your doctor.  Use an inhaler, cool mist vaporizer, or humidifier as told by your doctor.  Rinse your mouth often with salt water. To make salt water, dissolve -1 tsp (3-6 g) of salt in 1 cup (237 mL) of warm water.  Keep all follow-up visits as told by your doctor. This is important.   How is this prevented? To lower your risk of getting this condition again:  Wash your hands often with soap and water. If soap and water are not available, use hand sanitizer.  Avoid contact with people who have cold symptoms.  Try not to touch your mouth, nose, or eyes with your hands.  Make sure to get the flu shot every year.   Contact a doctor if:  Your symptoms do not get better in 2 weeks.  You vomit more than once or twice.  You have symptoms of loss of fluid from your body (dehydration). These include: ? Dark urine. ? Dry skin or eyes. ? Increased thirst. ? Headaches. ? Confusion. ? Muscle  cramps. Get help right away if:  You cough up blood.  You have chest pain.  You have very bad shortness of breath.  You become dehydrated.  You faint or keep feeling like you are going to faint.  You keep vomiting.  You have a very bad headache.  Your fever or chills get worse. These symptoms may be an emergency. Do not wait to see if the symptoms will go away. Get medical help right away. Call your local emergency services (911 in the [place] Do not drive yourself to the hospital. Summary  Acute bronchitis is when air tubes in the lungs (bronchi) suddenly get swollen. In adults, acute bronchitis usually goes away within 2 weeks.  Take over-the-counter and prescription medicines only as told by your doctor.  Drink enough fluid to keep your pee (urine) pale yellow.  Contact a doctor if your symptoms do not improve after 2 weeks of treatment.  Get help right away if you cough up blood, faint, or have chest pain or shortness of breath. This information is not intended to replace advice given to you by your health care provider. Make sure you discuss any questions you have with your health care provider. Document Revised: 05/01/2019 Document Reviewed: 05/01/2019 Elsevier Patient Education  [place].

## 2021-03-09 NOTE — Progress Notes (Signed)
Acute Office Visit  Subjective:    Patient ID: Angel Orr, male    DOB: 04-01-1951, 70 y.o.   MRN: [id number]  CC: Cough  HPI Patient is in today for cough. Onset of symptoms was approximately 3-weeks ago.Telemed visit with PCP on 02/27/21 was diagnosed with URI, prescribed course of Omnicef and Tessalon Perles. Pt taken to [facility] on 02/28/21 via EMS for generalized weakness. Labs and chest x-rays negative. Pt received IVF and was d/c home.   He tells me that he completed Omnicef and Tessalon Perles lacked efficacy. He states he is a former cigarette smoker, quit 2003 after 32 years. He denies previous diagnosis of COPD or emphysema.     Past Medical History:  Diagnosis Date  . Chronic GERD   . Depression   . Hyperlipemia   . Major depressive disorder, single episode, moderate ([place])     Past Surgical History:  Procedure Laterality Date  . ANKLE SURGERY      Family History  Problem Relation Age of Onset  . CVA Mother   . CAD Mother   . CAD Father   . Cancer Sister   . CAD Sister     Social History   Socioeconomic History  . Marital status: Married    Spouse name: Not on file  . Number of children: Not on file  . Years of education: Not on file  . Highest education level: Not on file  Occupational History  . Occupation: truck Geophysicist/field seismologist  Tobacco Use  . Smoking status: Never Smoker  . Smokeless tobacco: Never Used  Vaping Use  . Vaping Use: Never used  Substance and Sexual Activity  . Alcohol use: Never  . Drug use: Never  . Sexual activity: Not on file  Other Topics Concern  . Not on file  Social History Narrative  . Not on file   Social Determinants of Health   Financial Resource Strain: Not on file  Food Insecurity: Not on file  Transportation Needs: Not on file  Physical Activity: Not on file  Stress: Not on file  Social Connections: Not on file  Intimate Partner Violence: Not on file    Outpatient Medications Prior to Visit   Medication Sig Dispense Refill  . aspirin 81 MG EC tablet Take by mouth.    Marland Kitchen atorvastatin (LIPITOR) 40 MG tablet Take 1 tablet (40 mg total) by mouth daily. 90 tablet 1  . fluticasone (FLONASE) 50 MCG/ACT nasal spray Place 2 sprays into both nostrils daily. 16 g 6  . meloxicam (MOBIC) 7.5 MG tablet Take 1 tablet (7.5 mg total) by mouth daily. 90 tablet 1  . Multiple Vitamins-Minerals (MULTI COMPLETE PO) Take by mouth.    . Omega-3 Fatty Acids (FISH OIL) 1000 MG CAPS Take by mouth.    Marland Kitchen omeprazole (PRILOSEC) 40 MG capsule Take 1 capsule (40 mg total) by mouth daily. 90 capsule 1  . sertraline (ZOLOFT) 100 MG tablet Take 1 tablet (100 mg total) by mouth daily. 90 tablet 1  . benzonatate (TESSALON) 100 MG capsule Take 1 capsule (100 mg total) by mouth 2 (two) times daily as needed for cough. 30 capsule 0  . cefdinir (OMNICEF) 300 MG capsule Take 1 capsule (300 mg total) by mouth 2 (two) times daily. 20 capsule 0   No facility-administered medications prior to visit.    Allergies  Allergen Reactions  . Aleve [Naproxen]   . Codeine   . Morphine     Review of  Systems  Constitutional: Negative for appetite change, fatigue and unexpected weight change.  HENT: Positive for hearing loss (bilateral ears ). Negative for congestion, ear pain, rhinorrhea, sinus pressure, sinus pain and tinnitus.   Eyes: Negative for pain.  Respiratory: Positive for cough and shortness of breath (worsens when lying down).   Cardiovascular: Negative for chest pain, palpitations and leg swelling.  Gastrointestinal: Negative for abdominal pain, constipation, diarrhea, nausea and vomiting.  Endocrine: Negative for cold intolerance, heat intolerance, polydipsia, polyphagia and polyuria.  Genitourinary: Negative for dysuria, frequency and hematuria.  Musculoskeletal: Negative for arthralgias, back pain, joint swelling and myalgias.  Skin: Negative for rash.  Allergic/Immunologic: Negative for environmental allergies.   Neurological: Negative for dizziness and headaches.  Hematological: Negative for adenopathy.  Psychiatric/Behavioral: Negative for decreased concentration and sleep disturbance. The patient is not nervous/anxious.        Objective:    Physical Exam Vitals reviewed.  Constitutional:      Appearance: Normal appearance. He is obese.  HENT:     Head: Normocephalic.     Ears:     Comments: Bilateral hearing aids in place    Nose: Congestion and rhinorrhea present.     Mouth/Throat:     Mouth: Mucous membranes are moist.  Cardiovascular:     Rate and Rhythm: Normal rate and regular rhythm.     Pulses: Normal pulses.     Heart sounds: Normal heart sounds.  Pulmonary:     Effort: Pulmonary effort is normal.     Breath sounds: Wheezing present.     Comments: Lung sounds diminished in all fields; expiratory wheezing in bilateral lower posterior lobes Abdominal:     General: Bowel sounds are normal.     Palpations: Abdomen is soft.  Musculoskeletal:     Cervical back: Neck supple.  Skin:    General: Skin is warm and dry.     Capillary Refill: Capillary refill takes less than 2 seconds.  Neurological:     General: No focal deficit present.     Mental Status: He is alert and oriented to person, place, and time.  Psychiatric:        Mood and Affect: Mood normal.        Behavior: Behavior normal.     BP 102/62 (BP Location: Left Arm, Patient Position: Sitting)   Pulse 84   Temp 98 F (36.7 C) (Temporal)   Ht $R'5\' 11"'Uo$  (1.803 m)   Wt 232 lb (105.2 kg)   SpO2 97%   BMI 32.36 kg/m  Wt Readings from Last 3 Encounters:  03/09/21 232 lb (105.2 kg)  02/27/21 240 lb (108.9 kg)  01/23/21 240 lb (108.9 kg)     Lab Results  Component Value Date   WBC 6.0 01/09/2021   HGB 12.4 (L) 01/09/2021   HCT 38.9 01/09/2021   MCV 87 01/09/2021   PLT 227 01/09/2021   Lab Results  Component Value Date   NA 144 01/09/2021   K 4.6 01/09/2021   CO2 22 01/09/2021   GLUCOSE 96 01/09/2021    BUN 14 01/09/2021   CREATININE 0.87 01/09/2021   BILITOT 0.2 01/09/2021   ALKPHOS 91 01/09/2021   AST 19 01/09/2021   ALT 23 01/09/2021   PROT 6.6 01/09/2021   ALBUMIN 4.2 01/09/2021   CALCIUM 8.9 01/09/2021   EGFR 93 01/09/2021   Lab Results  Component Value Date   CHOL 202 (H) 01/09/2021   Lab Results  Component Value Date   HDL 57  01/09/2021   Lab Results  Component Value Date   LDLCALC 103 (H) 01/09/2021   Lab Results  Component Value Date   TRIG 250 (H) 01/09/2021   Lab Results  Component Value Date   CHOLHDL 3.5 01/09/2021        Assessment & Plan:   1. Seasonal allergic rhinitis due to pollen -continue Flonase nasal spray daily -Claritin 10 mg daily  2. Bronchitis - Budeson-Glycopyrrol-Formoterol (BREZTRI AEROSPHERE) 160-9-4.8 MCG/ACT AERO; Inhale 2 puffs into the lungs in the morning and at bedtime.  Dispense: 10.7 g; Refill: 0  3. Cough - POC COVID-19-negative - Budeson-Glycopyrrol-Formoterol (BREZTRI AEROSPHERE) 160-9-4.8 MCG/ACT AERO; Inhale 2 puffs into the lungs in the morning and at bedtime.  Dispense: 10.7 g; Refill: 0  4. Former heavy cigarette smoker (20-39 per day) - Budeson-Glycopyrrol-Formoterol (BREZTRI AEROSPHERE) 160-9-4.8 MCG/ACT AERO; Inhale 2 puffs into the lungs in the morning and at bedtime.  Dispense: 10.7 g; Refill: 0    Use Breztri inhaler once daily, rinse mouth out afterwards Begin Claritin 10 mg daily for allergies Begin Delsym cough syrup Remove laundry basket out of bedroom Follow-up if no improvement  Follow-up: As needed  Signed, Rip Harbour, NP

## 2021-05-24 ENCOUNTER — Other Ambulatory Visit: Payer: Self-pay

## 2021-05-24 ENCOUNTER — Encounter: Payer: Self-pay | Admitting: Legal Medicine

## 2021-05-24 ENCOUNTER — Ambulatory Visit (INDEPENDENT_AMBULATORY_CARE_PROVIDER_SITE_OTHER): Payer: Medicare Other | Admitting: Legal Medicine

## 2021-05-24 VITALS — BP 102/70 | HR 77 | Temp 97.3°F | Ht 71.0 in | Wt 228.4 lb

## 2021-05-24 DIAGNOSIS — R040 Epistaxis: Secondary | ICD-10-CM

## 2021-05-24 DIAGNOSIS — H906 Mixed conductive and sensorineural hearing loss, bilateral: Secondary | ICD-10-CM

## 2021-05-24 NOTE — Progress Notes (Signed)
`  Established Patient Office Visit  Subjective:  Patient ID: Angel Orr, male    DOB: 1951-02-06  Age: 70 y.o. MRN: [id number]  CC:  Chief Complaint  Patient presents with   Epistaxis    HPI [name] presents for recurrent epistaxis.  He has epistaxis intermittantly for 2 to 3 years.  Can stop on own , lasts 15 minutes.  Past Medical History:  Diagnosis Date   Chronic GERD    Depression    Hyperlipemia    Major depressive disorder, single episode, moderate ([facility])     Past Surgical History:  Procedure Laterality Date   ANKLE SURGERY      Family History  Problem Relation Age of Onset   CVA Mother    CAD Mother    CAD Father    Cancer Sister    CAD Sister     Social History   Socioeconomic History   Marital status: Married    Spouse name: Not on file   Number of children: Not on file   Years of education: Not on file   Highest education level: Not on file  Occupational History   Occupation: truck driver  Tobacco Use   Smoking status: Former    Types: Cigarettes    Quit date: 2003    Years since quitting: 19.6   Smokeless tobacco: Never  Vaping Use   Vaping Use: Never used  Substance and Sexual Activity   Alcohol use: Never   Drug use: Never   Sexual activity: Not on file  Other Topics Concern   Not on file  Social History Narrative   Not on file   Social Determinants of Health   Financial Resource Strain: Not on file  Food Insecurity: Not on file  Transportation Needs: Not on file  Physical Activity: Not on file  Stress: Not on file  Social Connections: Not on file  Intimate Partner Violence: Not on file    Outpatient Medications Prior to Visit  Medication Sig Dispense Refill   aspirin 81 MG EC tablet Take by mouth.     atorvastatin (LIPITOR) 40 MG tablet Take 1 tablet (40 mg total) by mouth daily. 90 tablet 1   Budeson-Glycopyrrol-Formoterol (BREZTRI AEROSPHERE) 160-9-4.8 MCG/ACT AERO Inhale 2 puffs into the lungs in the morning  and at bedtime. 10.7 g 0   fluticasone (FLONASE) 50 MCG/ACT nasal spray Place 2 sprays into both nostrils daily. 16 g 6   meloxicam (MOBIC) 7.5 MG tablet Take 1 tablet (7.5 mg total) by mouth daily. 90 tablet 1   Multiple Vitamins-Minerals (MULTI COMPLETE PO) Take by mouth.     Omega-3 Fatty Acids (FISH OIL) 1000 MG CAPS Take by mouth.     omeprazole (PRILOSEC) 40 MG capsule Take 1 capsule (40 mg total) by mouth daily. 90 capsule 1   sertraline (ZOLOFT) 100 MG tablet Take 1 tablet (100 mg total) by mouth daily. 90 tablet 1   No facility-administered medications prior to visit.    Allergies  Allergen Reactions   Aleve [Naproxen]    Codeine    Morphine     ROS Review of Systems  Constitutional:  Negative for chills, fatigue and fever.  HENT:  Negative for congestion, ear pain and sore throat.        Epistaxis  Respiratory:  Negative for cough and shortness of breath.   Cardiovascular:  Negative for chest pain.  Gastrointestinal:  Negative for abdominal pain, constipation, diarrhea, nausea and vomiting.  Endocrine: Negative for polydipsia,  polyphagia and polyuria.  Genitourinary:  Negative for dysuria and frequency.  Musculoskeletal:  Negative for arthralgias and myalgias.  Neurological:  Negative for dizziness and headaches.  Psychiatric/Behavioral:  Negative for dysphoric mood.        No dysphoria     Objective:    Physical Exam Vitals reviewed.  Constitutional:      General: He is in acute distress.     Appearance: Normal appearance. He is obese.  HENT:     Right Ear: Tympanic membrane normal.     Left Ear: Tympanic membrane normal.     Nose:     Comments: Polyp in left naries Eyes:     Extraocular Movements: Extraocular movements intact.     Conjunctiva/sclera: Conjunctivae normal.     Pupils: Pupils are equal, round, and reactive to light.  Cardiovascular:     Pulses: Normal pulses.     Heart sounds: Normal heart sounds.  Pulmonary:     Effort: Pulmonary effort  is normal.     Breath sounds: Normal breath sounds.  Abdominal:     General: Abdomen is flat. Bowel sounds are normal.     Palpations: Abdomen is soft.  Neurological:     Mental Status: He is alert.    BP 102/70   Pulse 77   Temp (!) 97.3 F (36.3 C)   Ht _0  (1.803 m)   Wt 228 lb 6.4 oz (103.6 kg)   SpO2 96%   BMI 31.86 kg/m  Wt Readings from Last 3 Encounters:  05/24/21 228 lb 6.4 oz (103.6 kg)  03/09/21 232 lb (105.2 kg)  02/27/21 240 lb (108.9 kg)     Health Maintenance Due  Topic Date Due   Zoster Vaccines- Shingrix (1 of 2) Never done   Fecal DNA (Cologuard)  03/31/2021    There are no preventive care reminders to display for this patient.  No results found for: TSH Lab Results  Component Value Date   WBC 6.0 01/09/2021   HGB 12.4 (L) 01/09/2021   HCT 38.9 01/09/2021   MCV 87 01/09/2021   PLT 227 01/09/2021   Lab Results  Component Value Date   NA 144 01/09/2021   K 4.6 01/09/2021   CO2 22 01/09/2021   GLUCOSE 96 01/09/2021   BUN 14 01/09/2021   CREATININE 0.87 01/09/2021   BILITOT 0.2 01/09/2021   ALKPHOS 91 01/09/2021   AST 19 01/09/2021   ALT 23 01/09/2021   PROT 6.6 01/09/2021   ALBUMIN 4.2 01/09/2021   CALCIUM 8.9 01/09/2021   EGFR 93 01/09/2021   Lab Results  Component Value Date   CHOL 202 (H) 01/09/2021   Lab Results  Component Value Date   HDL 57 01/09/2021   Lab Results  Component Value Date   LDLCALC 103 (H) 01/09/2021   Lab Results  Component Value Date   TRIG 250 (H) 01/09/2021   Lab Results  Component Value Date   CHOLHDL 3.5 01/09/2021   No results found for: HGBA1C    Assessment & Plan:   Diagnoses and all orders for this visit: Epistaxis -     Ambulatory referral to ENT Patient has recurrent epistaxis with new polyp found on left side.  Needs removal  Mixed hearing loss, bilateral  Chronic hearing loss with hearing aids    Follow-up: Return as sheduled with sally.    Reinaldo Meeker, MD

## 2021-06-06 ENCOUNTER — Encounter: Payer: Self-pay | Admitting: Physician Assistant

## 2021-06-06 ENCOUNTER — Telehealth: Payer: Self-pay

## 2021-06-06 ENCOUNTER — Telehealth (INDEPENDENT_AMBULATORY_CARE_PROVIDER_SITE_OTHER): Payer: Medicare Other | Admitting: Physician Assistant

## 2021-06-06 VITALS — Temp 98.9°F | Ht 71.0 in

## 2021-06-06 DIAGNOSIS — U071 COVID-19: Secondary | ICD-10-CM | POA: Insufficient documentation

## 2021-06-06 MED ORDER — MOLNUPIRAVIR EUA 200MG CAPSULE: 4.0000 | ORAL_CAPSULE | Freq: Two times a day (BID) | ORAL | 0 refills | Status: AC

## 2021-06-06 MED ORDER — MOLNUPIRAVIR EUA 200MG CAPSULE: 4.0000 | ORAL_CAPSULE | Freq: Two times a day (BID) | ORAL | 0 refills | Status: DC

## 2021-06-06 NOTE — Telephone Encounter (Signed)
Pt called states he tested positive for COVID at home on Sunday. States he has congestion, dizziness, and cough. Suggested video visit, but pt does not have video capability. Pt has not been taking any medications for symptoms. Suggested delsym or mucinex and ibuprofen or tylenol for symptoms. Pt VU, also made pt aware would send message to provider for further advice.   Royce Macadamia, [place] 06/06/21 8:55 AM

## 2021-06-06 NOTE — Telephone Encounter (Signed)
Pt returned call. Pt made an appointment for video visit with link sent to text message.   Royce Macadamia, [place] 06/06/21 10:39 AM

## 2021-06-06 NOTE — Progress Notes (Signed)
Virtual Visit via Telephone Note   This visit type was conducted due to national recommendations for restrictions regarding the COVID-19 Pandemic (e.g. social distancing) in an effort to limit this patient's exposure and mitigate transmission in our community.  Due to his co-morbid illnesses, this patient is at least at moderate risk for complications without adequate follow up.  This format is felt to be most appropriate for this patient at this time.  The patient did not have access to video technology/had technical difficulties with video requiring transitioning to audio format only (telephone).  All issues noted in this document were discussed and addressed.  No physical exam could be performed with this format.  Patient verbally consented to a telehealth visit.   Date:  06/06/2021   ID:  [name], DOB November 25, 1950, MRN [id number]  Patient Location: Home Provider Location: Office  PCP:  Marge Duncans, PA-C  Chief Complaint:  COVID  History of Present Illness:    Angel Orr is a 70 y.o. male with symptoms of COVID - he has had a headache, sore throat , fatigue and loss of taste since Sunday - states overall he is feeling some better but still fatigued He did take a home test that was positive  The patient does have symptoms concerning for COVID-19 infection (fever, chills, cough, or new shortness of breath).    Past Medical History:  Diagnosis Date   Chronic GERD    Depression    Hyperlipemia    Major depressive disorder, single episode, moderate ([facility])    Past Surgical History:  Procedure Laterality Date   ANKLE SURGERY       Current Meds  Medication Sig   aspirin 81 MG EC tablet Take by mouth.   atorvastatin (LIPITOR) 40 MG tablet Take 1 tablet (40 mg total) by mouth daily.   Budeson-Glycopyrrol-Formoterol (BREZTRI AEROSPHERE) 160-9-4.8 MCG/ACT AERO Inhale 2 puffs into the lungs in the morning and at bedtime.   fluticasone (FLONASE) 50 MCG/ACT nasal spray Place 2  sprays into both nostrils daily.   meloxicam (MOBIC) 7.5 MG tablet Take 1 tablet (7.5 mg total) by mouth daily.   molnupiravir EUA 200 mg CAPS Take 4 capsules (800 mg total) by mouth 2 (two) times daily for 5 days.   Multiple Vitamins-Minerals (MULTI COMPLETE PO) Take by mouth.   Omega-3 Fatty Acids (FISH OIL) 1000 MG CAPS Take by mouth.   omeprazole (PRILOSEC) 40 MG capsule Take 1 capsule (40 mg total) by mouth daily.   sertraline (ZOLOFT) 100 MG tablet Take 1 tablet (100 mg total) by mouth daily.     Allergies:   Aleve [naproxen], Codeine, and Morphine   Social History   Tobacco Use   Smoking status: Former    Types: Cigarettes    Quit date: 2003    Years since quitting: 19.6   Smokeless tobacco: Never  Vaping Use   Vaping Use: Never used  Substance Use Topics   Alcohol use: Never   Drug use: Never     Family Hx: The patient's family history includes CAD in his father, mother, and sister; CVA in his mother; Cancer in his sister.  ROS:   Please see the history of present illness.    All other systems reviewed and are negative.  Labs/Other Tests and Data Reviewed:    Recent Labs: 01/09/2021: ALT 23; BUN 14; Creatinine, Ser 0.87; Hemoglobin 12.4; Platelets 227; Potassium 4.6; Sodium 144   Recent Lipid Panel Lab Results  Component Value Date/Time  CHOL 202 (H) 01/09/2021 09:44 AM   TRIG 250 (H) 01/09/2021 09:44 AM   HDL 57 01/09/2021 09:44 AM   CHOLHDL 3.5 01/09/2021 09:44 AM   LDLCALC 103 (H) 01/09/2021 09:44 AM    Wt Readings from Last 3 Encounters:  05/24/21 228 lb 6.4 oz (103.6 kg)  03/09/21 232 lb (105.2 kg)  02/27/21 240 lb (108.9 kg)     Objective:    Vital Signs:  Temp 98.9 F (37.2 C)   Ht '5\' 11"'$  (1.803 m)   BMI 31.86 kg/m    VITAL SIGNS:  reviewed GEN:  no acute distress  ASSESSMENT & PLAN:    COVID 19 - recommend rest, fluids, tylenol - will send in molnupiravir to take as directed - follow up if any symptoms change or worsen  COVID-19  Education: The signs and symptoms of COVID-19 were discussed with the patient and how to seek care for testing (follow up with PCP or arrange E-visit). The importance of social distancing was discussed today.  Time:   Today, I have spent 10 minutes with the patient with telehealth technology discussing the above problems.     Medication Adjustments/Labs and Tests Ordered: Current medicines are reviewed at length with the patient today.  Concerns regarding medicines are outlined above.   Tests Ordered: No orders of the defined types were placed in this encounter.   Medication Changes: Meds ordered this encounter  Medications   molnupiravir EUA 200 mg CAPS    Sig: Take 4 capsules (800 mg total) by mouth 2 (two) times daily for 5 days.    Dispense:  40 capsule    Refill:  0    Order Specific Question:   Supervising Provider    AnswerShelton Silvas    Follow Up:  In Person prn  Signed, Webb Silversmith, PA-C  06/06/2021 11:07 AM    [place]

## 2021-06-06 NOTE — Telephone Encounter (Signed)
Pt returned call. Pt states he does have smart phone. Sent mychart link for set up to text message. Pt will return call if he can get this set up.   Royce Macadamia, [place] 06/06/21 9:20 AM

## 2021-06-06 NOTE — Addendum Note (Signed)
Addended by: Billie Lade C on: 06/06/2021 02:13 PM   Modules accepted: Orders

## 2021-06-13 DIAGNOSIS — J310 Chronic rhinitis: Secondary | ICD-10-CM | POA: Diagnosis not present

## 2021-06-13 DIAGNOSIS — J342 Deviated nasal septum: Secondary | ICD-10-CM | POA: Diagnosis not present

## 2021-06-13 DIAGNOSIS — J3489 Other specified disorders of nose and nasal sinuses: Secondary | ICD-10-CM | POA: Diagnosis not present

## 2021-06-13 DIAGNOSIS — R040 Epistaxis: Secondary | ICD-10-CM | POA: Diagnosis not present

## 2021-07-05 ENCOUNTER — Other Ambulatory Visit: Payer: Self-pay | Admitting: Physician Assistant

## 2021-07-05 DIAGNOSIS — E782 Mixed hyperlipidemia: Secondary | ICD-10-CM

## 2021-07-05 DIAGNOSIS — K219 Gastro-esophageal reflux disease without esophagitis: Secondary | ICD-10-CM

## 2021-07-05 DIAGNOSIS — F419 Anxiety disorder, unspecified: Secondary | ICD-10-CM

## 2021-07-12 ENCOUNTER — Encounter: Payer: Self-pay | Admitting: Physician Assistant

## 2021-07-12 ENCOUNTER — Other Ambulatory Visit: Payer: Self-pay

## 2021-07-12 ENCOUNTER — Ambulatory Visit (INDEPENDENT_AMBULATORY_CARE_PROVIDER_SITE_OTHER): Payer: Medicare Other | Admitting: Physician Assistant

## 2021-07-12 VITALS — BP 102/74 | HR 62 | Temp 97.5°F | Ht 71.0 in | Wt 232.0 lb

## 2021-07-12 DIAGNOSIS — Z125 Encounter for screening for malignant neoplasm of prostate: Secondary | ICD-10-CM

## 2021-07-12 DIAGNOSIS — R3912 Poor urinary stream: Secondary | ICD-10-CM | POA: Diagnosis not present

## 2021-07-12 DIAGNOSIS — R5383 Other fatigue: Secondary | ICD-10-CM

## 2021-07-12 DIAGNOSIS — E782 Mixed hyperlipidemia: Secondary | ICD-10-CM

## 2021-07-12 NOTE — Progress Notes (Signed)
Subjective:  Patient ID: Angel Orr, male    DOB: 03-08-1951  Age: 70 y.o. MRN: [id number]  Chief Complaint  Patient presents with  . Hyperlipidemia    HPI  Mixed hyperlipidemia  Pt presents with hyperlipidemia.  The patient is compliant with medications, maintains a low cholesterol diet , follows up as directed , and maintains an exercise regimen . The patient denies experiencing any hypercholesterolemia related symptomspt currently on lipitor 40mg  qd  Pt with history of anxiety - currnelty on zoloft 100mg  qd - states medication working well  Pt with history of GERD - currently taking prilosec 40mg  qd - works well for him  Pt states he recently had COVID 19 - has had some persistent malaise - this was only about 2-3 weeks ago - states is improving however Denies chest pain/sob/edema   Current Outpatient Medications on File Prior to Visit  Medication Sig Dispense Refill  . aspirin 81 MG EC tablet Take by mouth.    Marland Kitchen atorvastatin (LIPITOR) 40 MG tablet TAKE ONE TABLET BY MOUTH DAILY 90 tablet 1  . Budeson-Glycopyrrol-Formoterol (BREZTRI AEROSPHERE) 160-9-4.8 MCG/ACT AERO Inhale 2 puffs into the lungs in the morning and at bedtime. 10.7 g 0  . fluticasone (FLONASE) 50 MCG/ACT nasal spray Place 2 sprays into both nostrils daily. 16 g 6  . meloxicam (MOBIC) 7.5 MG tablet TAKE ONE TABLET BY MOUTH DAILY WITH FOOD 90 tablet 1  . Multiple Vitamins-Minerals (MULTI COMPLETE PO) Take by mouth.    . Omega-3 Fatty Acids (FISH OIL) 1000 MG CAPS Take by mouth.    Marland Kitchen omeprazole (PRILOSEC) 40 MG capsule TAKE ONE CAPSULE BY MOUTH DAILY 90 capsule 1  . sertraline (ZOLOFT) 100 MG tablet TAKE ONE TABLET BY MOUTH DAILY 90 tablet 1   No current facility-administered medications on file prior to visit.   Past Medical History:  Diagnosis Date  . Chronic GERD   . Depression   . Hyperlipemia   . Major depressive disorder, single episode, moderate ([place])    Past Surgical History:  Procedure  Laterality Date  . ANKLE SURGERY      Family History  Problem Relation Age of Onset  . CVA Mother   . CAD Mother   . CAD Father   . Cancer Sister   . CAD Sister    Social History   Socioeconomic History  . Marital status: Married    Spouse name: Not on file  . Number of children: Not on file  . Years of education: Not on file  . Highest education level: Not on file  Occupational History  . Occupation: truck Geophysicist/field seismologist  Tobacco Use  . Smoking status: Former    Types: Cigarettes    Quit date: 2003    Years since quitting: 19.7  . Smokeless tobacco: Never  Vaping Use  . Vaping Use: Never used  Substance and Sexual Activity  . Alcohol use: Never  . Drug use: Never  . Sexual activity: Not on file  Other Topics Concern  . Not on file  Social History Narrative  . Not on file   Social Determinants of Health   Financial Resource Strain: Not on file  Food Insecurity: Not on file  Transportation Needs: Not on file  Physical Activity: Not on file  Stress: Not on file  Social Connections: Not on file    Review of Systems CONSTITUTIONAL: see HPI E/N/T: Negative for ear pain, nasal congestion and sore throat.  CARDIOVASCULAR: Negative for chest pain, dizziness, palpitations  and pedal edema.  RESPIRATORY: Negative for recent cough and dyspnea.  GASTROINTESTINAL: Negative for abdominal pain, acid reflux symptoms, constipation, diarrhea, nausea and vomiting.  MSK: Negative for arthralgias and myalgias.  INTEGUMENTARY: Negative for rash.  NEUROLOGICAL: Negative for dizziness and headaches.  PSYCHIATRIC: Negative for sleep disturbance and to question depression screen.  Negative for depression, negative for anhedonia.       Objective:  BP 102/74 (BP Location: Left Arm, Patient Position: Sitting, Cuff Size: Large)   Pulse 62   Temp (!) 97.5 F (36.4 C) (Temporal)   Ht 5\' 11"  (1.803 m)   Wt 232 lb (105.2 kg)   SpO2 94%   BMI 32.36 kg/m   BP/Weight 07/12/2021 06/06/2021  11/30/9240  Systolic BP 683 - 419  Diastolic BP 74 - 70  Wt. (Lbs) 232 - 228.4  BMI 32.36 31.86 31.86    Physical Exam PHYSICAL EXAM:   VS: BP 102/74 (BP Location: Left Arm, Patient Position: Sitting, Cuff Size: Large)   Pulse 62   Temp (!) 97.5 F (36.4 C) (Temporal)   Ht 5\' 11"  (1.803 m)   Wt 232 lb (105.2 kg)   SpO2 94%   BMI 32.36 kg/m   GEN: Well nourished, well developed, in no acute distress  Neck: no JVD or masses - no thyromegaly Cardiac: RRR; no murmurs, rubs, or gallops,no edema - Respiratory:  normal respiratory rate and pattern with no distress - normal breath sounds with no rales, rhonchi, wheezes or rubs MS: no deformity or atrophy  Skin: warm and dry, no rash  Neuro:  Alert and Oriented x 3, - CN II-Xii grossly intact Psych: euthymic mood, appropriate affect and demeanor  Diabetic Foot Exam - Simple   No data filed      Lab Results  Component Value Date   WBC 6.0 01/09/2021   HGB 12.4 (L) 01/09/2021   HCT 38.9 01/09/2021   PLT 227 01/09/2021   GLUCOSE 96 01/09/2021   CHOL 202 (H) 01/09/2021   TRIG 250 (H) 01/09/2021   HDL 57 01/09/2021   LDLCALC 103 (H) 01/09/2021   ALT 23 01/09/2021   AST 19 01/09/2021   NA 144 01/09/2021   K 4.6 01/09/2021   CL 106 01/09/2021   CREATININE 0.87 01/09/2021   BUN 14 01/09/2021   CO2 22 01/09/2021      Assessment & Plan:   Problem List Items Addressed This Visit       Other   Mixed hyperlipidemia   Relevant Orders   Lipid panel Watch diet and take meds   Other Visit Diagnoses     Other fatigue    -  Primary   Relevant Orders   CBC with Differential/Platelet   Comprehensive metabolic panel   TSH   Screening for prostate cancer       Relevant Orders   PSA   Poor urinary stream        Relevant Orders   PSA     .  No orders of the defined types were placed in this encounter.   Orders Placed This Encounter  Procedures  . CBC with Differential/Platelet  . Comprehensive metabolic panel   . TSH  . Lipid panel  . PSA     Follow-up: Return in about 3 months (around 10/11/2021) for chronic fasting follow up.  An After Visit Summary was printed and given to the patient.  Yetta Flock [facility] [phone]

## 2021-07-13 LAB — COMPREHENSIVE METABOLIC PANEL
ALT: 19 IU/L (ref 0–44)
AST: 16 IU/L (ref 0–40)
Albumin/Globulin Ratio: 1.5 (ref 1.2–2.2)
Albumin: 4 g/dL (ref 3.7–4.7)
Alkaline Phosphatase: 85 IU/L (ref 44–121)
BUN/Creatinine Ratio: 13 (ref 10–24)
BUN: 14 mg/dL (ref 8–27)
Bilirubin Total: 0.4 mg/dL (ref 0.0–1.2)
CO2: 23 mmol/L (ref 20–29)
Calcium: 9.3 mg/dL (ref 8.6–10.2)
Chloride: 105 mmol/L (ref 96–106)
Creatinine, Ser: 1.07 mg/dL (ref 0.76–1.27)
Globulin, Total: 2.7 g/dL (ref 1.5–4.5)
Glucose: 93 mg/dL (ref 65–99)
Potassium: 4.2 mmol/L (ref 3.5–5.2)
Sodium: 141 mmol/L (ref 134–144)
Total Protein: 6.7 g/dL (ref 6.0–8.5)
eGFR: 74 mL/min/{1.73_m2} (ref >59)

## 2021-07-13 LAB — CBC WITH DIFFERENTIAL/PLATELET
Basophils Absolute: 0.1 10*3/uL (ref 0.0–0.2)
Basos: 1 %
EOS (ABSOLUTE): 0.2 10*3/uL (ref 0.0–0.4)
Eos: 4 %
Hematocrit: 36.8 % — ABNORMAL LOW (ref 37.5–51.0)
Hemoglobin: 11.9 g/dL — ABNORMAL LOW (ref 13.0–17.7)
Immature Grans (Abs): 0 10*3/uL (ref 0.0–0.1)
Immature Granulocytes: 0 %
Lymphocytes Absolute: 1.7 10*3/uL (ref 0.7–3.1)
Lymphs: 30 %
MCH: 28 pg (ref 26.6–33.0)
MCHC: 32.3 g/dL (ref 31.5–35.7)
MCV: 87 fL (ref 79–97)
Monocytes Absolute: 0.4 10*3/uL (ref 0.1–0.9)
Monocytes: 8 %
Neutrophils Absolute: 3.1 10*3/uL (ref 1.4–7.0)
Neutrophils: 57 %
Platelets: 219 10*3/uL (ref 150–450)
RBC: 4.25 x10E6/uL (ref 4.14–5.80)
RDW: 13.9 % (ref 11.6–15.4)
WBC: 5.5 10*3/uL (ref 3.4–10.8)

## 2021-07-13 LAB — LIPID PANEL
Chol/HDL Ratio: 3.4 ratio (ref 0.0–5.0)
Cholesterol, Total: 195 mg/dL (ref 100–199)
HDL: 58 mg/dL (ref >39)
LDL Chol Calc (NIH): 102 mg/dL — ABNORMAL HIGH (ref 0–99)
Triglycerides: 205 mg/dL — ABNORMAL HIGH (ref 0–149)
VLDL Cholesterol Cal: 35 mg/dL (ref 5–40)

## 2021-07-13 LAB — PSA: Prostate Specific Ag, Serum: 0.2 ng/mL (ref 0.0–4.0)

## 2021-07-13 LAB — CARDIOVASCULAR RISK ASSESSMENT

## 2021-07-13 LAB — TSH: TSH: 3.31 u[IU]/mL (ref 0.450–4.500)

## 2021-07-28 ENCOUNTER — Ambulatory Visit: Payer: Medicare Other

## 2021-07-31 ENCOUNTER — Other Ambulatory Visit: Payer: Self-pay

## 2021-07-31 DIAGNOSIS — D649 Anemia, unspecified: Secondary | ICD-10-CM

## 2021-07-31 NOTE — Progress Notes (Signed)
Patient hemoccults were all negative.

## 2021-08-22 ENCOUNTER — Ambulatory Visit (INDEPENDENT_AMBULATORY_CARE_PROVIDER_SITE_OTHER): Payer: Medicare Other | Admitting: Physician Assistant

## 2021-08-22 ENCOUNTER — Other Ambulatory Visit: Payer: Self-pay

## 2021-08-22 ENCOUNTER — Encounter: Payer: Self-pay | Admitting: Physician Assistant

## 2021-08-22 VITALS — BP 102/74 | HR 88 | Temp 97.3°F | Ht 71.0 in | Wt 233.8 lb

## 2021-08-22 DIAGNOSIS — R0789 Other chest pain: Secondary | ICD-10-CM | POA: Diagnosis not present

## 2021-08-22 DIAGNOSIS — R079 Chest pain, unspecified: Secondary | ICD-10-CM | POA: Diagnosis not present

## 2021-08-22 LAB — TROPONIN T: Troponin T (Highly Sensitive): <6 ng/L (ref 0–22)

## 2021-08-22 NOTE — Progress Notes (Signed)
Ad

## 2021-08-22 NOTE — Progress Notes (Signed)
Subjective:  Patient ID: Angel Orr, male    DOB: May 17, 1951  Age: 70 y.o. MRN: [id number]  Chief Complaint  Patient presents with   Arm Pain    HPI  Pt states that for the past month he has had pain under his right breast intermittently - states he only feels it when he reaches out with his right arm and then it goes away  However upon further questioning the main concern he admits to is that he has been having intermittent left sided chest pain 'for years' (although patient has never mentioned having in prior visits) States that most of the time the pain occurs with activity - says he has a mild discomfort but at times can become more intense - last episode was 3 weeks ago States he has had cardiology workup in the past but has been over 15 years Denies any symptoms today Current Outpatient Medications on File Prior to Visit  Medication Sig Dispense Refill   aspirin 81 MG EC tablet Take by mouth.     atorvastatin (LIPITOR) 40 MG tablet TAKE ONE TABLET BY MOUTH DAILY 90 tablet 1   Budeson-Glycopyrrol-Formoterol (BREZTRI AEROSPHERE) 160-9-4.8 MCG/ACT AERO Inhale 2 puffs into the lungs in the morning and at bedtime. 10.7 g 0   fluticasone (FLONASE) 50 MCG/ACT nasal spray Place 2 sprays into both nostrils daily. 16 g 6   meloxicam (MOBIC) 7.5 MG tablet TAKE ONE TABLET BY MOUTH DAILY WITH FOOD 90 tablet 1   Multiple Vitamins-Minerals (MULTI COMPLETE PO) Take by mouth.     Omega-3 Fatty Acids (FISH OIL) 1000 MG CAPS Take by mouth.     omeprazole (PRILOSEC) 40 MG capsule TAKE ONE CAPSULE BY MOUTH DAILY 90 capsule 1   sertraline (ZOLOFT) 100 MG tablet TAKE ONE TABLET BY MOUTH DAILY 90 tablet 1   No current facility-administered medications on file prior to visit.   Past Medical History:  Diagnosis Date   Chronic GERD    Depression    Hyperlipemia    Major depressive disorder, single episode, moderate ([facility])    Past Surgical History:  Procedure Laterality Date   ANKLE SURGERY       Family History  Problem Relation Age of Onset   CVA Mother    CAD Mother    CAD Father    Cancer Sister    CAD Sister    Social History   Socioeconomic History   Marital status: Married    Spouse name: Not on file   Number of children: Not on file   Years of education: Not on file   Highest education level: Not on file  Occupational History   Occupation: truck driver  Tobacco Use   Smoking status: Former    Types: Cigarettes    Quit date: 2003    Years since quitting: 19.8   Smokeless tobacco: Never  Vaping Use   Vaping Use: Never used  Substance and Sexual Activity   Alcohol use: Never   Drug use: Never   Sexual activity: Not on file  Other Topics Concern   Not on file  Social History Narrative   Not on file   Social Determinants of Health   Financial Resource Strain: Not on file  Food Insecurity: Not on file  Transportation Needs: Not on file  Physical Activity: Not on file  Stress: Not on file  Social Connections: Not on file    Review of Systems CONSTITUTIONAL: Negative for chills, fatigue, fever, unintentional weight gain and unintentional  weight loss.  E/N/T: Negative for ear pain, nasal congestion and sore throat.  CARDIOVASCULAR: see HPI RESPIRATORY: Negative for recent cough and dyspnea.  GASTROINTESTINAL: Negative for abdominal pain, acid reflux symptoms, constipation, diarrhea, nausea and vomiting.  MSK: Negative for arthralgias and myalgias.  INTEGUMENTARY: Negative for rash.  NEUROLOGICAL: Negative for dizziness and headaches.  PSYCHIATRIC: Negative for sleep disturbance and to question depression screen.  Negative for depression, negative for anhedonia.       Objective:  BP 102/74 (BP Location: Left Arm, Patient Position: Sitting, Cuff Size: Large)   Pulse 88   Temp (!) 97.3 F (36.3 C) (Temporal)   Ht 5\' 11"  (1.803 m)   Wt 233 lb 12.8 oz (106.1 kg)   SpO2 95%   BMI 32.61 kg/m   BP/Weight 08/22/2021 07/12/2021 1/61/0960  Systolic BP  454 098 -  Diastolic BP 74 74 -  Wt. (Lbs) 233.8 232 -  BMI 32.61 32.36 31.86    Physical Exam PHYSICAL EXAM:   VS: BP 102/74 (BP Location: Left Arm, Patient Position: Sitting, Cuff Size: Large)   Pulse 88   Temp (!) 97.3 F (36.3 C) (Temporal)   Ht 5\' 11"  (1.803 m)   Wt 233 lb 12.8 oz (106.1 kg)   SpO2 95%   BMI 32.61 kg/m   GEN: Well nourished, well developed, in no acute distress  HEENT: normal external ears and nose - normal external auditory canals and TMS - hearing grossly normal - normal nasal mucosa and septum - Lips, Teeth and Gums - normal  Oropharynx - normal mucosa, palate, and posterior pharynx Neck: no JVD or masses - no thyromegaly Cardiac: RRR; no murmurs, rubs, or gallops,no edema -  Respiratory:  normal respiratory rate and pattern with no distress - normal breath sounds with no rales, rhonchi, wheezes or rubs Skin: warm and dry, no rash  Neuro:  Alert and Oriented x 3, - CN II-Xii grossly intact Psych: euthymic mood, appropriate affect and demeanor  EKG - no acute changes - PVC noted Diabetic Foot Exam - Simple   No data filed      Lab Results  Component Value Date   WBC 5.5 07/12/2021   HGB 11.9 (L) 07/12/2021   HCT 36.8 (L) 07/12/2021   PLT 219 07/12/2021   GLUCOSE 93 07/12/2021   CHOL 195 07/12/2021   TRIG 205 (H) 07/12/2021   HDL 58 07/12/2021   LDLCALC 102 (H) 07/12/2021   ALT 19 07/12/2021   AST 16 07/12/2021   NA 141 07/12/2021   K 4.2 07/12/2021   CL 105 07/12/2021   CREATININE 1.07 07/12/2021   BUN 14 07/12/2021   CO2 23 07/12/2021   TSH 3.310 07/12/2021      Assessment & Plan:   Problem List Items Addressed This Visit   None Visit Diagnoses     Other chest pain    -  Primary   Relevant Orders   EKG 12-Lead   CBC with Differential/Platelet   Comprehensive metabolic panel   Troponin T   Ambulatory referral to Cardiology   DG Chest 2 View     .  No orders of the defined types were placed in this  encounter.   Orders Placed This Encounter  Procedures   DG Chest 2 View   CBC with Differential/Platelet   Comprehensive metabolic panel   Troponin T   Ambulatory referral to Cardiology   EKG 12-Lead     Follow-up: Return for pt has follow up in january.  An After Visit Summary was printed and given to the patient.  Yetta Flock [facility] [phone]

## 2021-08-23 LAB — COMPREHENSIVE METABOLIC PANEL
ALT: 19 IU/L (ref 0–44)
AST: 19 IU/L (ref 0–40)
Albumin/Globulin Ratio: 1.8 (ref 1.2–2.2)
Albumin: 4.3 g/dL (ref 3.7–4.7)
Alkaline Phosphatase: 85 IU/L (ref 44–121)
BUN/Creatinine Ratio: 15 (ref 10–24)
BUN: 17 mg/dL (ref 8–27)
Bilirubin Total: 0.3 mg/dL (ref 0.0–1.2)
CO2: 23 mmol/L (ref 20–29)
Calcium: 8.9 mg/dL (ref 8.6–10.2)
Chloride: 108 mmol/L — ABNORMAL HIGH (ref 96–106)
Creatinine, Ser: 1.1 mg/dL (ref 0.76–1.27)
Globulin, Total: 2.4 g/dL (ref 1.5–4.5)
Glucose: 94 mg/dL (ref 70–99)
Potassium: 4 mmol/L (ref 3.5–5.2)
Sodium: 144 mmol/L (ref 134–144)
Total Protein: 6.7 g/dL (ref 6.0–8.5)
eGFR: 72 mL/min/{1.73_m2} (ref >59)

## 2021-08-23 LAB — CBC WITH DIFFERENTIAL/PLATELET
Basophils Absolute: 0.1 10*3/uL (ref 0.0–0.2)
Basos: 1 %
EOS (ABSOLUTE): 0.2 10*3/uL (ref 0.0–0.4)
Eos: 3 %
Hematocrit: 34.8 % — ABNORMAL LOW (ref 37.5–51.0)
Hemoglobin: 11.7 g/dL — ABNORMAL LOW (ref 13.0–17.7)
Immature Grans (Abs): 0 10*3/uL (ref 0.0–0.1)
Immature Granulocytes: 0 %
Lymphocytes Absolute: 2.5 10*3/uL (ref 0.7–3.1)
Lymphs: 31 %
MCH: 28.7 pg (ref 26.6–33.0)
MCHC: 33.6 g/dL (ref 31.5–35.7)
MCV: 86 fL (ref 79–97)
Monocytes Absolute: 0.7 10*3/uL (ref 0.1–0.9)
Monocytes: 9 %
Neutrophils Absolute: 4.4 10*3/uL (ref 1.4–7.0)
Neutrophils: 56 %
Platelets: 210 10*3/uL (ref 150–450)
RBC: 4.07 x10E6/uL — ABNORMAL LOW (ref 4.14–5.80)
RDW: 14.1 % (ref 11.6–15.4)
WBC: 7.8 10*3/uL (ref 3.4–10.8)

## 2021-09-04 DIAGNOSIS — R110 Nausea: Secondary | ICD-10-CM | POA: Diagnosis not present

## 2021-09-04 DIAGNOSIS — R9341 Abnormal radiologic findings on diagnostic imaging of renal pelvis, ureter, or bladder: Secondary | ICD-10-CM | POA: Diagnosis not present

## 2021-09-04 DIAGNOSIS — S20219A Contusion of unspecified front wall of thorax, initial encounter: Secondary | ICD-10-CM | POA: Diagnosis not present

## 2021-09-04 DIAGNOSIS — I7143 Infrarenal abdominal aortic aneurysm, without rupture: Secondary | ICD-10-CM | POA: Diagnosis not present

## 2021-09-04 DIAGNOSIS — R1084 Generalized abdominal pain: Secondary | ICD-10-CM | POA: Diagnosis not present

## 2021-09-04 DIAGNOSIS — R109 Unspecified abdominal pain: Secondary | ICD-10-CM | POA: Diagnosis not present

## 2021-09-04 DIAGNOSIS — K5730 Diverticulosis of large intestine without perforation or abscess without bleeding: Secondary | ICD-10-CM | POA: Diagnosis not present

## 2021-09-04 DIAGNOSIS — I714 Abdominal aortic aneurysm, without rupture, unspecified: Secondary | ICD-10-CM | POA: Diagnosis not present

## 2021-09-04 DIAGNOSIS — I10 Essential (primary) hypertension: Secondary | ICD-10-CM | POA: Diagnosis not present

## 2021-09-04 DIAGNOSIS — I719 Aortic aneurysm of unspecified site, without rupture: Secondary | ICD-10-CM | POA: Diagnosis not present

## 2021-09-11 ENCOUNTER — Other Ambulatory Visit: Payer: Self-pay

## 2021-09-11 ENCOUNTER — Encounter: Payer: Self-pay | Admitting: Physician Assistant

## 2021-09-11 ENCOUNTER — Ambulatory Visit (INDEPENDENT_AMBULATORY_CARE_PROVIDER_SITE_OTHER): Payer: Medicare Other | Admitting: Physician Assistant

## 2021-09-11 VITALS — BP 120/70 | HR 90 | Temp 97.3°F | Ht 71.0 in | Wt 236.2 lb

## 2021-09-11 DIAGNOSIS — D649 Anemia, unspecified: Secondary | ICD-10-CM | POA: Diagnosis not present

## 2021-09-11 DIAGNOSIS — N320 Bladder-neck obstruction: Secondary | ICD-10-CM

## 2021-09-11 DIAGNOSIS — K921 Melena: Secondary | ICD-10-CM | POA: Diagnosis not present

## 2021-09-11 DIAGNOSIS — B029 Zoster without complications: Secondary | ICD-10-CM | POA: Diagnosis not present

## 2021-09-11 MED ORDER — ACYCLOVIR 800 MG PO TABS: 800.0000 mg | ORAL_TABLET | Freq: Every day | ORAL | 0 refills | Status: DC

## 2021-09-11 MED ORDER — PREDNISONE 20 MG PO TABS: ORAL_TABLET | ORAL | 0 refills | Status: AC

## 2021-09-11 NOTE — Progress Notes (Signed)
Subjective:  Patient ID: Angel Orr, male    DOB: Mar 04, 1951  Age: 70 y.o. 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     atorvastatin (LIPITOR) 40 MG tablet TAKE ONE TABLET BY MOUTH DAILY 90 tablet 1   Budeson-Glycopyrrol-Formoterol (BREZTRI AEROSPHERE) 160-9-4.8 MCG/ACT AERO Inhale 2 puffs into the lungs in the morning and at bedtime. 10.7 g 0   diclofenac Sodium (VOLTAREN) 1 % GEL Apply topically.     fluticasone (FLONASE) 50 MCG/ACT nasal spray Place 2 sprays into both nostrils daily. 16 g 6   meloxicam (MOBIC) 7.5 MG tablet TAKE ONE TABLET BY MOUTH DAILY WITH FOOD 90 tablet 1   Multiple Vitamins-Minerals (MULTI COMPLETE PO) Take by mouth.     Omega-3 Fatty Acids (FISH OIL) 1000 MG CAPS Take by mouth.     omeprazole (PRILOSEC) 40 MG capsule TAKE ONE CAPSULE BY MOUTH DAILY 90 capsule 1   sertraline (ZOLOFT) 100 MG tablet TAKE ONE TABLET BY MOUTH DAILY 90 tablet 1   No current facility-administered medications on file prior to visit.   Past Medical History:  Diagnosis Date   Chronic GERD    Depression     Hyperlipemia    Major depressive disorder, single episode, moderate ([facility])    Past Surgical History:  Procedure Laterality Date   ANKLE SURGERY      Family History  Problem Relation Age of Onset   CVA Mother    CAD Mother    CAD Father    Cancer Sister    CAD Sister    Social History   Socioeconomic History   Marital status: Married    Spouse name: Not on file   Number of children: Not on file   Years of education: Not on file   Highest education level: Not on file  Occupational History   Occupation: truck driver  Tobacco Use   Smoking status: Former    Types: Cigarettes    Quit date: 2003    Years since quitting: 19.9   Smokeless tobacco: Never  Vaping Use   Vaping Use: Never used  Substance and Sexual Activity   Alcohol use: Never   Drug use: Never   Sexual activity: Not on file  Other Topics Concern   Not on file  Social History Narrative   Not on file   Social Determinants of Health   Financial Resource Strain: Not on file  Food Insecurity: Not on file  Transportation Needs: Not on file  Physical Activity: Not on  file  Stress: Not on file  Social Connections: Not on file    Review of Systems CONSTITUTIONAL: Negative for chills, fatigue, fever, unintentional weight gain and unintentional weight loss.  E/N/T: Negative for ear pain, nasal congestion and sore throat.  CARDIOVASCULAR: Negative for chest pain, dizziness, palpitations and pedal edema.  RESPIRATORY: Negative for recent cough and dyspnea.  GASTROINTESTINAL: see HPI MSK: Negative for arthralgias and myalgias.  INTEGUMENTARY: see HPI        Objective:  BP 120/70 (BP Location: Left Arm, Patient Position: Sitting, Cuff Size: Normal)   Pulse 90   Temp (!) 97.3 F (36.3 C) (Temporal)   Ht 5\' 11"  (1.803 m)   Wt 236 lb 3.2 oz (107.1 kg)   SpO2 95%   BMI 32.94 kg/m   BP/Weight 09/11/2021 08/22/2021 04/23/5008  Systolic BP 381 829 937  Diastolic BP 70 74 74  Wt. (Lbs) 236.2 233.8 232  BMI  32.94 32.61 32.36    Physical Exam PHYSICAL EXAM:   VS: BP 120/70 (BP Location: Left Arm, Patient Position: Sitting, Cuff Size: Normal)   Pulse 90   Temp (!) 97.3 F (36.3 C) (Temporal)   Ht 5\' 11"  (1.803 m)   Wt 236 lb 3.2 oz (107.1 kg)   SpO2 95%   BMI 32.94 kg/m   GEN: Well nourished, well developed, in no acute distress  Cardiac: RRR; no murmurs, Respiratory:  normal respiratory rate and pattern with no distress - normal breath sounds with no rales, rhonchi, wheezes or rubs GI: normal bowel sounds, no masses or tenderness Skin: blistered, scabbing rash noted in dermatome distribution right flank Psych: euthymic mood, appropriate affect and demeanor  Diabetic Foot Exam - Simple   No data filed      Lab Results  Component Value Date   WBC 7.8 08/22/2021   HGB 11.7 (L) 08/22/2021   HCT 34.8 (L) 08/22/2021   PLT 210 08/22/2021   GLUCOSE 94 08/22/2021   CHOL 195 07/12/2021   TRIG 205 (H) 07/12/2021   HDL 58 07/12/2021   LDLCALC 102 (H) 07/12/2021   ALT 19 08/22/2021   AST 19 08/22/2021   NA 144 08/22/2021   K 4.0 08/22/2021   CL 108 (H) 08/22/2021   CREATININE 1.10 08/22/2021   BUN 17 08/22/2021   CO2 23 08/22/2021   TSH 3.310 07/12/2021      Assessment & Plan:   Problem List Items Addressed This Visit   None Visit Diagnoses     Herpes zoster without complication    -  Primary   Relevant Medications   predniSONE (DELTASONE) 20 MG tablet   acyclovir (ZOVIRAX) 800 MG tablet   Anemia, unspecified type       Relevant Orders   Ambulatory referral to Gastroenterology   Melena       Relevant Orders   Ambulatory referral to Gastroenterology   Bladder outlet obstruction       Relevant Orders   Ambulatory referral to Urology     .  Meds ordered this encounter  Medications   predniSONE (DELTASONE) 20 MG tablet    Sig: Take 3 tablets (60 mg total) by mouth daily with breakfast for 3 days, THEN 2 tablets (40 mg total) daily with breakfast for 3 days,  THEN 1 tablet (20 mg total) daily with breakfast for 3 days.    Dispense:  18 tablet    Refill:  0    Order Specific Question:   Supervising Provider    Answer:  COX, KIRSTEN [id number]   acyclovir (ZOVIRAX) 800 MG tablet    Sig: Take 1 tablet (800 mg total) by mouth 5 (five) times daily.    Dispense:  35 tablet    Refill:  0    Order Specific Question:   Supervising Provider    Answer:   Rochel Brome [phone]    Orders Placed This Encounter  Procedures   Ambulatory referral to Gastroenterology   Ambulatory referral to Urology   As patient was leaving he had been told to see GI because of mild anemia and due for colonoscopy - he mentions now on 2 occasions in past few weeks he has noted dark black stool ---- would like to see GI in [place] ---- will make that referral as well  Follow-up: pt has chronic fasting follow up in January  An After Visit Summary was printed and given to the patient.  Yetta Flock [facility] [phone]

## 2021-10-03 ENCOUNTER — Other Ambulatory Visit: Payer: Self-pay | Admitting: Physician Assistant

## 2021-10-03 DIAGNOSIS — K219 Gastro-esophageal reflux disease without esophagitis: Secondary | ICD-10-CM

## 2021-10-03 DIAGNOSIS — E782 Mixed hyperlipidemia: Secondary | ICD-10-CM

## 2021-10-03 DIAGNOSIS — F32A Depression, unspecified: Secondary | ICD-10-CM | POA: Insufficient documentation

## 2021-10-03 DIAGNOSIS — F419 Anxiety disorder, unspecified: Secondary | ICD-10-CM

## 2021-10-04 DIAGNOSIS — D649 Anemia, unspecified: Secondary | ICD-10-CM | POA: Diagnosis not present

## 2021-10-04 DIAGNOSIS — R1310 Dysphagia, unspecified: Secondary | ICD-10-CM | POA: Diagnosis not present

## 2021-10-05 ENCOUNTER — Ambulatory Visit (INDEPENDENT_AMBULATORY_CARE_PROVIDER_SITE_OTHER): Payer: Medicare Other | Admitting: Cardiology

## 2021-10-05 ENCOUNTER — Encounter: Payer: Self-pay | Admitting: Cardiology

## 2021-10-05 ENCOUNTER — Other Ambulatory Visit: Payer: Self-pay

## 2021-10-05 VITALS — BP 104/62 | HR 80 | Ht 71.0 in | Wt 234.0 lb

## 2021-10-05 DIAGNOSIS — E782 Mixed hyperlipidemia: Secondary | ICD-10-CM

## 2021-10-05 DIAGNOSIS — H906 Mixed conductive and sensorineural hearing loss, bilateral: Secondary | ICD-10-CM | POA: Diagnosis not present

## 2021-10-05 DIAGNOSIS — Z8616 Personal history of COVID-19: Secondary | ICD-10-CM

## 2021-10-05 DIAGNOSIS — R0609 Other forms of dyspnea: Secondary | ICD-10-CM

## 2021-10-05 NOTE — Progress Notes (Signed)
Cardiology Consultation:    Date:  10/05/2021   ID:  Massey Ruhland, DOB 04/26/1951, MRN [id number]  PCP:  Marge Duncans, PA-C  Cardiologist:  Jenne Campus, MD   Referring MD: Marge Duncans, PA-C   Chief Complaint  Patient presents with   heart evaluation    H/o high cholesterol    History of Present Illness:    Angel Orr is a 70 y.o. male who is being seen today for the evaluation of dyslipidemia, shortness of breath at the request of Marge Duncans, PA-C.  Past medical history significant for essential hypertension, dyslipidemia, multiple family members with premature coronary artery disease.  He was referred to [place] because of dyspnea on exertion.  Upon June 2020 he suffered from COVID-19 infection was exactly 2 years ago in December.  Since that time he is gradually getting worse getting shortness of breath.  He said anytime he tried to walk you get short of breath there was no chest pain tightness squeezing pressure burning chest.  There is no swelling of lower extremities there is no paroxysmal nocturnal dyspnea just shortness of breath.  It is gradually getting worse. He quit drinking many years ago he quit smoking few years ago, He does not exercise on the regular basis he is not on any special diet He does have multiple family members dying prematurely because of coronary artery disease but all of them were drinkers and smokers.  Past Medical History:  Diagnosis Date   Chronic GERD    Depression    Hyperlipemia    Major depressive disorder, single episode, moderate (HCC)     Past Surgical History:  Procedure Laterality Date   ANKLE SURGERY      Current Medications: Current Meds  Medication Sig   aspirin 81 MG EC tablet Take by mouth.   atorvastatin (LIPITOR) 40 MG tablet TAKE ONE TABLET BY MOUTH DAILY (Patient taking differently: Take 40 mg by mouth daily.)   meloxicam (MOBIC) 7.5 MG tablet TAKE ONE TABLET BY MOUTH DAILY WITH FOOD (Patient taking differently: Take  7.5 mg by mouth daily.)   Multiple Vitamins-Minerals (MULTI COMPLETE PO) Take by mouth.   Omega-3 Fatty Acids (FISH OIL) 1000 MG CAPS Take by mouth.   omeprazole (PRILOSEC) 40 MG capsule TAKE ONE CAPSULE BY MOUTH DAILY (Patient taking differently: Take 40 mg by mouth daily.)   sertraline (ZOLOFT) 100 MG tablet TAKE ONE TABLET BY MOUTH DAILY (Patient taking differently: Take 100 mg by mouth daily.)     Allergies:   Aleve [naproxen], Codeine, and Morphine   Social History   Socioeconomic History   Marital status: Married    Spouse name: Not on file   Number of children: Not on file   Years of education: Not on file   Highest education level: Not on file  Occupational History   Occupation: truck driver  Tobacco Use   Smoking status: Former    Types: Cigarettes    Quit date: 2003    Years since quitting: 19.9   Smokeless tobacco: Never  Vaping Use   Vaping Use: Never used  Substance and Sexual Activity   Alcohol use: Never   Drug use: Never   Sexual activity: Not on file  Other Topics Concern   Not on file  Social History Narrative   Not on file   Social Determinants of Health   Financial Resource Strain: Not on file  Food Insecurity: Not on file  Transportation Needs: Not on file  Physical Activity: Not  on file  Stress: Not on file  Social Connections: Not on file     Family History: The patient's family history includes CAD in his father, mother, and sister; CVA in his mother; Cancer in his sister. ROS:   Please see the history of present illness.    All 14 point review of systems negative except as described per history of present illness.  EKGs/Labs/Other Studies Reviewed:    The following studies were reviewed today:   EKG:  EKG is  ordered today.  The ekg ordered today demonstrates normal sinus rhythm normal P interval normal QS complex duration fulgent no ST segment changes  Recent Labs: 07/12/2021: TSH 3.310 08/22/2021: ALT 19; BUN 17; Creatinine, Ser  1.10; Hemoglobin 11.7; Platelets 210; Potassium 4.0; Sodium 144  Recent Lipid Panel    Component Value Date/Time   CHOL 195 07/12/2021 0831   TRIG 205 (H) 07/12/2021 0831   HDL 58 07/12/2021 0831   CHOLHDL 3.4 07/12/2021 0831   LDLCALC 102 (H) 07/12/2021 0831    Physical Exam:    VS:  BP 104/62 (BP Location: Left Arm, Patient Position: Sitting)    Pulse 80    Ht 5\' 11"  (1.803 m)    Wt 234 lb (106.1 kg)    SpO2 94%    BMI 32.64 kg/m     Wt Readings from Last 3 Encounters:  10/05/21 234 lb (106.1 kg)  09/11/21 236 lb 3.2 oz (107.1 kg)  08/22/21 233 lb 12.8 oz (106.1 kg)     GEN:  Well nourished, well developed in no acute distress HEENT: Normal NECK: No JVD; No carotid bruits LYMPHATICS: No lymphadenopathy CARDIAC: RRR, no murmurs, no rubs, no gallops RESPIRATORY:  Clear to auscultation without rales, wheezing or rhonchi  ABDOMEN: Soft, non-tender, non-distended MUSCULOSKELETAL:  No edema; No deformity  SKIN: Warm and dry NEUROLOGIC:  Alert and oriented x 3 PSYCHIATRIC:  Normal affect   ASSESSMENT:    1. Mixed hyperlipidemia   2. Dyspnea on exertion   3. History of COVID-19   4. Mixed hearing loss, bilateral    PLAN:    In order of problems listed above:  This is on exertion.  Multifactorial probably deconditioning obesity play some role here, however, I have to make sure he does not have any cardiomyopathy.  I will schedule him to have an echocardiogram.  I will not put him on any medication until have better understanding what the reason for his symptomatology is.  In the future if echocardiogram will be unrevealing we may consider work-up for CAD though he does not have any symptoms that would suggest that except shortness of breath.  Of course it can be anginal equivalent.  He is already on aspirin as well as statin which I will continue. Mixed dyslipidemia I did review his K PN which show me his LDL of 102 HDL 58 this is from last year however he is on Lipitor 40.  We  will continue present management. Mixed hearing loss which is difficult to converse with him but currently managed. History of COVID-19.  Of course we will check his heart make sure that he is problem not related to COVID infection.  If cardiac work-up unrevealing he may require to see pulmonologist for CT of pulmonary function test.   Medication Adjustments/Labs and Tests Ordered: Current medicines are reviewed at length with the patient today.  Concerns regarding medicines are outlined above.  No orders of the defined types were placed in this encounter.  No orders of the defined types were placed in this encounter.   Signed, Park Liter, MD, [facility]. 10/05/2021 4:24 PM    [place]

## 2021-10-05 NOTE — Patient Instructions (Signed)
Medication Instructions:  Your physician recommends that you continue on your current medications as directed. Please refer to the Current Medication list given to you today.  *If you need a refill on your cardiac medications before your next appointment, please call your pharmacy*   Lab Work: None If you have labs (blood work) drawn today and your tests are completely normal, you will receive your results only by: [place] (if you have MyChart) OR A paper copy in the mail If you have any lab test that is abnormal or we need to change your treatment, we will call you to review the results.   Testing/Procedures: Your physician has requested that you have an echocardiogram. Echocardiography is a painless test that uses sound waves to create images of your heart. It provides your doctor with information about the size and shape of your heart and how well your hearts chambers and valves are working. This procedure takes approximately one hour. There are no restrictions for this procedure.    Follow-Up: At [facility], you and your health needs are our priority.  As part of our continuing mission to provide you with exceptional heart care, we have created designated Provider Care Teams.  These Care Teams include your primary Cardiologist (physician) and Advanced Practice Providers (APPs -  Physician Assistants and Nurse Practitioners) who all work together to provide you with the care you need, when you need it.  We recommend signing up for the patient portal called "MyChart".  Sign up information is provided on this After Visit Summary.  MyChart is used to connect with patients for Virtual Visits (Telemedicine).  Patients are able to view lab/test results, encounter notes, upcoming appointments, etc.  Non-urgent messages can be sent to your provider as well.   To learn more about what you can do with MyChart, go to [email or web address].    Your next appointment:   2  month(s)  The format for your next appointment:   In Person  Provider:   Jenne Campus, MD    Other Instructions  Echocardiogram An echocardiogram is a test that uses sound waves (ultrasound) to produce images of the heart. Images from an echocardiogram can provide important information about: Heart size and shape. The size and thickness and movement of your heart's walls. Heart muscle function and strength. Heart valve function or if you have stenosis. Stenosis is when the heart valves are too narrow. If blood is flowing backward through the heart valves (regurgitation). A tumor or infectious growth around the heart valves. Areas of heart muscle that are not working well because of poor blood flow or injury from a heart attack. Aneurysm detection. An aneurysm is a weak or damaged part of an artery wall. The wall bulges out from the normal force of blood pumping through the body. Tell a health care provider about: Any allergies you have. All medicines you are taking, including vitamins, herbs, eye drops, creams, and over-the-counter medicines. Any blood disorders you have. Any surgeries you have had. Any medical conditions you have. Whether you are pregnant or may be pregnant. What are the risks? Generally, this is a safe test. However, problems may occur, including an allergic reaction to dye (contrast) that may be used during the test. What happens before the test? No specific preparation is needed. You may eat and drink normally. What happens during the test?  You will take off your clothes from the waist up and put on a hospital gown. Electrodes or electrocardiogram (ECG)patches may be  placed on your chest. The electrodes or patches are then connected to a device that monitors your heart rate and rhythm. You will lie down on a table for an ultrasound exam. A gel will be applied to your chest to help sound waves pass through your skin. A handheld device, called a  transducer, will be pressed against your chest and moved over your heart. The transducer produces sound waves that travel to your heart and bounce back (or "echo" back) to the transducer. These sound waves will be captured in real-time and changed into images of your heart that can be viewed on a video monitor. The images will be recorded on a computer and reviewed by your health care provider. You may be asked to change positions or hold your breath for a short time. This makes it easier to get different views or better views of your heart. In some cases, you may receive contrast through an IV in one of your veins. This can improve the quality of the pictures from your heart. The procedure may vary among health care providers and hospitals. What can I expect after the test? You may return to your normal, everyday life, including diet, activities, and medicines, unless your health care provider tells you not to do that. Follow these instructions at home: It is up to you to get the results of your test. Ask your health care provider, or the department that is doing the test, when your results will be ready. Keep all follow-up visits. This is important. Summary An echocardiogram is a test that uses sound waves (ultrasound) to produce images of the heart. Images from an echocardiogram can provide important information about the size and shape of your heart, heart muscle function, heart valve function, and other possible heart problems. You do not need to do anything to prepare before this test. You may eat and drink normally. After the echocardiogram is completed, you may return to your normal, everyday life, unless your health care provider tells you not to do that. This information is not intended to replace advice given to you by your health care provider. Make sure you discuss any questions you have with your health care provider. Document Revised: 06/21/2021 Document Reviewed: 05/31/2020 Elsevier  Patient Education  2022 [employer].

## 2021-10-05 NOTE — Addendum Note (Signed)
Addended by: Senaida Ores on: 10/05/2021 04:31 PM   Modules accepted: Orders

## 2021-10-24 ENCOUNTER — Ambulatory Visit (INDEPENDENT_AMBULATORY_CARE_PROVIDER_SITE_OTHER): Payer: Medicare Other

## 2021-10-24 ENCOUNTER — Other Ambulatory Visit: Payer: Self-pay

## 2021-10-24 DIAGNOSIS — H906 Mixed conductive and sensorineural hearing loss, bilateral: Secondary | ICD-10-CM

## 2021-10-24 DIAGNOSIS — E782 Mixed hyperlipidemia: Secondary | ICD-10-CM | POA: Diagnosis not present

## 2021-10-24 DIAGNOSIS — Z8616 Personal history of COVID-19: Secondary | ICD-10-CM

## 2021-10-24 DIAGNOSIS — R0609 Other forms of dyspnea: Secondary | ICD-10-CM | POA: Diagnosis not present

## 2021-10-24 LAB — ECHOCARDIOGRAM COMPLETE
AR max vel: 1.49 cm2
AV Area VTI: 1.6 cm2
AV Area mean vel: 1.51 cm2
AV Mean grad: 10 mmHg
AV Peak grad: 20.8 mmHg
Ao pk vel: 2.28 m/s
Area-P 1/2: 3.85 cm2
P 1/2 time: 517 msec
S' Lateral: 2.9 cm

## 2021-10-24 MED ORDER — PERFLUTREN LIPID MICROSPHERE
1.0000 mL | INTRAVENOUS | Status: AC | PRN
  Administered 2021-10-24: 7 mL via INTRAVENOUS

## 2021-10-25 ENCOUNTER — Ambulatory Visit (INDEPENDENT_AMBULATORY_CARE_PROVIDER_SITE_OTHER): Payer: Medicare Other | Admitting: Physician Assistant

## 2021-10-25 ENCOUNTER — Encounter: Payer: Self-pay | Admitting: Physician Assistant

## 2021-10-25 VITALS — BP 120/68 | HR 84 | Temp 97.2°F | Ht 71.0 in | Wt 234.0 lb

## 2021-10-25 DIAGNOSIS — E782 Mixed hyperlipidemia: Secondary | ICD-10-CM

## 2021-10-25 DIAGNOSIS — F3289 Other specified depressive episodes: Secondary | ICD-10-CM

## 2021-10-25 DIAGNOSIS — K219 Gastro-esophageal reflux disease without esophagitis: Secondary | ICD-10-CM | POA: Diagnosis not present

## 2021-10-25 NOTE — Progress Notes (Signed)
Subjective:  Patient ID: Angel Orr, male    DOB: 1951-04-14  Age: 71 y.o. MRN: [id number]  Chief Complaint  Patient presents with   Hyperlipidemia    HPI  Mixed hyperlipidemia  Pt presents with hyperlipidemia.  Compliance with treatment has been goodThe patient is compliant with medications, maintains a low cholesterol diet , follows up as directed , and maintains an exercise regimen . The patient denies experiencing any hypercholesterolemia related symptoms.  Currently on fish oil and lipitor 40mg  qd  Pt with history of GERD - stable on prilosec 40mg  qd Of note pt is scheduled for colonoscopy 3/23  Pt with history of depression - stable on zoloft 100mg  qd  Pt was having some mild exertional dyspnea - has had cardiology evaluation and an echocardiogram - per pt test was normal    Current Outpatient Medications on File Prior to Visit  Medication Sig Dispense Refill   aspirin 81 MG EC tablet Take by mouth.     atorvastatin (LIPITOR) 40 MG tablet TAKE ONE TABLET BY MOUTH DAILY (Patient taking differently: Take 40 mg by mouth daily.) 90 tablet 1   meloxicam (MOBIC) 7.5 MG tablet TAKE ONE TABLET BY MOUTH DAILY WITH FOOD (Patient taking differently: Take 7.5 mg by mouth daily.) 90 tablet 1   Multiple Vitamins-Minerals (MULTI COMPLETE PO) Take by mouth.     Omega-3 Fatty Acids (FISH OIL) 1000 MG CAPS Take by mouth.     omeprazole (PRILOSEC) 40 MG capsule TAKE ONE CAPSULE BY MOUTH DAILY (Patient taking differently: Take 40 mg by mouth daily.) 90 capsule 1   sertraline (ZOLOFT) 100 MG tablet TAKE ONE TABLET BY MOUTH DAILY (Patient taking differently: Take 100 mg by mouth daily.) 90 tablet 1   No current facility-administered medications on file prior to visit.   Past Medical History:  Diagnosis Date   Chronic GERD    Depression    Hyperlipemia    Major depressive disorder, single episode, moderate (HCC)    Past Surgical History:  Procedure Laterality Date   ANKLE SURGERY       Family History  Problem Relation Age of Onset   CVA Mother    CAD Mother    CAD Father    Cancer Sister    CAD Sister    Social History   Socioeconomic History   Marital status: Married    Spouse name: Not on file   Number of children: Not on file   Years of education: Not on file   Highest education level: Not on file  Occupational History   Occupation: truck driver  Tobacco Use   Smoking status: Former    Types: Cigarettes    Quit date: 2003    Years since quitting: 20.0   Smokeless tobacco: Never  Vaping Use   Vaping Use: Never used  Substance and Sexual Activity   Alcohol use: Never   Drug use: Never   Sexual activity: Not on file  Other Topics Concern   Not on file  Social History Narrative   Not on file   Social Determinants of Health   Financial Resource Strain: Not on file  Food Insecurity: Not on file  Transportation Needs: Not on file  Physical Activity: Not on file  Stress: Not on file  Social Connections: Not on file    Review of Systems CONSTITUTIONAL: Negative for chills, fatigue, fever, unintentional weight gain and unintentional weight loss.  E/N/T: Negative for ear pain, nasal congestion and sore throat.  CARDIOVASCULAR:  Negative for chest pain, dizziness, palpitations and pedal edema.  RESPIRATORY: Negative for recent cough and dyspnea.  GASTROINTESTINAL: Negative for abdominal pain, acid reflux symptoms, constipation, diarrhea, nausea and vomiting.  MSK: Negative for arthralgias and myalgias.  INTEGUMENTARY: Negative for rash.  NEUROLOGICAL: Negative for dizziness and headaches.  PSYCHIATRIC: Negative for sleep disturbance and to question depression screen.  Negative for depression, negative for anhedonia.       Objective:  BP 120/68 (BP Location: Left Arm, Patient Position: Sitting, Cuff Size: Large)    Pulse 84    Temp (!) 97.2 F (36.2 C) (Temporal)    Ht 5\' 11"  (1.803 m)    Wt 234 lb (106.1 kg)    SpO2 95%    BMI 32.64 kg/m    BP/Weight 10/25/2021 10/05/2021 10/93/2355  Systolic BP 732 202 542  Diastolic BP 68 62 70  Wt. (Lbs) 234 234 236.2  BMI 32.64 32.64 32.94    Physical Exam PHYSICAL EXAM:   VS: BP 120/68 (BP Location: Left Arm, Patient Position: Sitting, Cuff Size: Large)    Pulse 84    Temp (!) 97.2 F (36.2 C) (Temporal)    Ht 5\' 11"  (1.803 m)    Wt 234 lb (106.1 kg)    SpO2 95%    BMI 32.64 kg/m   GEN: Well nourished, well developed, in no acute distress  Cardiac: RRR; no murmurs, rubs, or gallops,no edema -  Respiratory:  normal respiratory rate and pattern with no distress - normal breath sounds with no rales, rhonchi, wheezes or rubs MS: no deformity or atrophy  Skin: warm and dry, no rash  Psych: euthymic mood, appropriate affect and demeanor  Diabetic Foot Exam - Simple   No data filed      Lab Results  Component Value Date   WBC 7.8 08/22/2021   HGB 11.7 (L) 08/22/2021   HCT 34.8 (L) 08/22/2021   PLT 210 08/22/2021   GLUCOSE 94 08/22/2021   CHOL 195 07/12/2021   TRIG 205 (H) 07/12/2021   HDL 58 07/12/2021   LDLCALC 102 (H) 07/12/2021   ALT 19 08/22/2021   AST 19 08/22/2021   NA 144 08/22/2021   K 4.0 08/22/2021   CL 108 (H) 08/22/2021   CREATININE 1.10 08/22/2021   BUN 17 08/22/2021   CO2 23 08/22/2021   TSH 3.310 07/12/2021      Assessment & Plan:   Problem List Items Addressed This Visit       Digestive   Gastroesophageal reflux disease without esophagitis Continue current meds     Other   Mixed hyperlipidemia - Primary   Relevant Orders   CBC with Differential/Platelet   Comprehensive metabolic panel   Lipid panel   Depression Continue zoloft as directed  .  No orders of the defined types were placed in this encounter.   Orders Placed This Encounter  Procedures   CBC with Differential/Platelet   Comprehensive metabolic panel   Lipid panel     Follow-up: Return in about 6 months (around 04/24/2022) for chronic fasting follow up.  An After  Visit Summary was printed and given to the patient.  Yetta Flock [facility] [phone]

## 2021-10-26 LAB — CBC WITH DIFFERENTIAL/PLATELET
Basophils Absolute: 0 10*3/uL (ref 0.0–0.2)
Basos: 1 %
EOS (ABSOLUTE): 0.3 10*3/uL (ref 0.0–0.4)
Eos: 4 %
Hematocrit: 35.4 % — ABNORMAL LOW (ref 37.5–51.0)
Hemoglobin: 11.7 g/dL — ABNORMAL LOW (ref 13.0–17.7)
Immature Grans (Abs): 0 10*3/uL (ref 0.0–0.1)
Immature Granulocytes: 0 %
Lymphocytes Absolute: 1.9 10*3/uL (ref 0.7–3.1)
Lymphs: 30 %
MCH: 28.5 pg (ref 26.6–33.0)
MCHC: 33.1 g/dL (ref 31.5–35.7)
MCV: 86 fL (ref 79–97)
Monocytes Absolute: 0.6 10*3/uL (ref 0.1–0.9)
Monocytes: 9 %
Neutrophils Absolute: 3.5 10*3/uL (ref 1.4–7.0)
Neutrophils: 56 %
Platelets: 199 10*3/uL (ref 150–450)
RBC: 4.1 x10E6/uL — ABNORMAL LOW (ref 4.14–5.80)
RDW: 13.4 % (ref 11.6–15.4)
WBC: 6.3 10*3/uL (ref 3.4–10.8)

## 2021-10-26 LAB — CARDIOVASCULAR RISK ASSESSMENT

## 2021-10-26 LAB — COMPREHENSIVE METABOLIC PANEL
ALT: 18 IU/L (ref 0–44)
AST: 20 IU/L (ref 0–40)
Albumin/Globulin Ratio: 2 (ref 1.2–2.2)
Albumin: 4.2 g/dL (ref 3.7–4.7)
Alkaline Phosphatase: 80 IU/L (ref 44–121)
BUN/Creatinine Ratio: 16 (ref 10–24)
BUN: 16 mg/dL (ref 8–27)
Bilirubin Total: 0.2 mg/dL (ref 0.0–1.2)
CO2: 23 mmol/L (ref 20–29)
Calcium: 9.3 mg/dL (ref 8.6–10.2)
Chloride: 106 mmol/L (ref 96–106)
Creatinine, Ser: 1.01 mg/dL (ref 0.76–1.27)
Globulin, Total: 2.1 g/dL (ref 1.5–4.5)
Glucose: 88 mg/dL (ref 70–99)
Potassium: 4.5 mmol/L (ref 3.5–5.2)
Sodium: 142 mmol/L (ref 134–144)
Total Protein: 6.3 g/dL (ref 6.0–8.5)
eGFR: 80 mL/min/{1.73_m2} (ref >59)

## 2021-10-26 LAB — LIPID PANEL
Chol/HDL Ratio: 2.7 ratio (ref 0.0–5.0)
Cholesterol, Total: 168 mg/dL (ref 100–199)
HDL: 62 mg/dL (ref >39)
LDL Chol Calc (NIH): 83 mg/dL (ref 0–99)
Triglycerides: 134 mg/dL (ref 0–149)
VLDL Cholesterol Cal: 23 mg/dL (ref 5–40)

## 2021-11-01 DIAGNOSIS — N3289 Other specified disorders of bladder: Secondary | ICD-10-CM | POA: Diagnosis not present

## 2021-11-01 DIAGNOSIS — N401 Enlarged prostate with lower urinary tract symptoms: Secondary | ICD-10-CM | POA: Diagnosis not present

## 2021-11-21 DIAGNOSIS — N401 Enlarged prostate with lower urinary tract symptoms: Secondary | ICD-10-CM | POA: Diagnosis not present

## 2021-11-21 DIAGNOSIS — N3289 Other specified disorders of bladder: Secondary | ICD-10-CM | POA: Diagnosis not present

## 2021-12-19 ENCOUNTER — Ambulatory Visit (INDEPENDENT_AMBULATORY_CARE_PROVIDER_SITE_OTHER): Payer: Medicare Other | Admitting: Cardiology

## 2021-12-19 ENCOUNTER — Encounter: Payer: Self-pay | Admitting: Cardiology

## 2021-12-19 ENCOUNTER — Other Ambulatory Visit: Payer: Self-pay

## 2021-12-19 VITALS — BP 136/74 | HR 66 | Ht 71.0 in | Wt 234.6 lb

## 2021-12-19 DIAGNOSIS — Z8616 Personal history of COVID-19: Secondary | ICD-10-CM | POA: Diagnosis not present

## 2021-12-19 DIAGNOSIS — R0789 Other chest pain: Secondary | ICD-10-CM | POA: Diagnosis not present

## 2021-12-19 DIAGNOSIS — R0609 Other forms of dyspnea: Secondary | ICD-10-CM | POA: Diagnosis not present

## 2021-12-19 DIAGNOSIS — E782 Mixed hyperlipidemia: Secondary | ICD-10-CM | POA: Diagnosis not present

## 2021-12-19 DIAGNOSIS — I350 Nonrheumatic aortic (valve) stenosis: Secondary | ICD-10-CM | POA: Diagnosis not present

## 2021-12-19 HISTORY — DX: Nonrheumatic aortic (valve) stenosis: I35.0

## 2021-12-19 MED ORDER — METOPROLOL TARTRATE 100 MG PO TABS
100.0000 mg | ORAL_TABLET | Freq: Once | ORAL | 0 refills | Status: DC
Start: 2021-12-19 — End: 2022-06-14

## 2021-12-19 NOTE — Patient Instructions (Addendum)
Medication Instructions:  Your physician recommends that you continue on your current medications as directed. Please refer to the Current Medication list given to you today.  *If you need a refill on your cardiac medications before your next appointment, please call your pharmacy*   Lab Work:  BMP 1 week before CT  If you have labs (blood work) drawn today and your tests are completely normal, you will receive your results only by: [place] (if you have MyChart) OR A paper copy in the mail If you have any lab test that is abnormal or we need to change your treatment, we will call you to review the results.   Testing/Procedures:   Your cardiac CT will be scheduled at one of the below locations:   [facility] [address] [place], [place] [address] [phone]  [place] [address] [place], [place] [address] [phone]  If scheduled at [facility], please arrive at the [facility]'s Entrance (Entrance C2) of [facility] 30 minutes prior to test start time. You can use the FREE valet parking offered at entrance C (encouraged to control the heart rate for the test)  Proceed to the [facility] Radiology Department (first floor) to check-in and test prep.  All radiology patients and guests should use entrance C2 at [facility], accessed from [facility], even though the hospital's physical address listed is [address]    If scheduled at [facility], please arrive 15 mins early for check-in and test prep.  Please follow these instructions carefully (unless otherwise directed):  Hold all erectile dysfunction medications at least 3 days (72 hrs) prior to test.  On the Night Before the Test: Be sure to Drink plenty of water. Do not consume any caffeinated/decaffeinated beverages or chocolate 12 hours prior to your  test. Do not take any antihistamines 12 hours prior to your test.   On the Day of the Test: Drink plenty of water until 1 hour prior to the test. Do not eat any food 4 hours prior to the test. You may take your regular medications prior to the test.  Take metoprolol (Lopressor) two hours prior to test.        After the Test: Drink plenty of water. After receiving IV contrast, you may experience a mild flushed feeling. This is normal. On occasion, you may experience a mild rash up to 24 hours after the test. This is not dangerous. If this occurs, you can take Benadryl 25 mg and increase your fluid intake. If you experience trouble breathing, this can be serious. If it is severe call 911 IMMEDIATELY. If it is mild, please call our office.  We will call to schedule your test 2-4 weeks out understanding that some insurance companies will need an authorization prior to the service being performed.   For non-scheduling related questions, please contact the cardiac imaging nurse navigator should you have any questions/concerns: Marchia Bond, Cardiac Imaging Nurse Navigator Gordy Clement, Cardiac Imaging Nurse Navigator [facility] Direct Office Dial: [phone]   For scheduling needs, including cancellations and rescheduling, please call [place], [phone].    Follow-Up: At [facility], you and your health needs are our priority.  As part of our continuing mission to provide you with exceptional heart care, we have created designated Provider Care Teams.  These Care Teams include your primary Cardiologist (physician) and Advanced  Practice Providers (APPs -  Physician Assistants and Nurse Practitioners) who all work together to provide you with the care you need, when you need it.  We recommend signing up for the patient portal called "MyChart".  Sign up information is provided on this After Visit Summary.  MyChart is used to connect with patients for  Virtual Visits (Telemedicine).  Patients are able to view lab/test results, encounter notes, upcoming appointments, etc.  Non-urgent messages can be sent to your provider as well.   To learn more about what you can do with MyChart, go to [email or web address].    Your next appointment:   3 month(s)  The format for your next appointment:   In Person  Provider:   Jenne Campus, MD    Other Instructions None

## 2021-12-19 NOTE — Addendum Note (Signed)
Addended by: Edwyna Shell I on: 12/19/2021 11:28 AM   Modules accepted: Orders

## 2021-12-19 NOTE — Progress Notes (Signed)
Cardiology Office Note:    Date:  12/19/2021   ID:  Terion Hedman, DOB [birth year], MRN [id number]  PCP:  Marge Duncans, PA-C  Cardiologist:  Jenne Campus, MD    Referring MD: Marge Duncans, PA-C   Chief Complaint  Patient presents with   Results  Still having shortness of breath.  History of Present Illness:    Angel Orr is a 71 y.o. male he was referred to [place] because of shortness of breath that happened after COVID.  Quite extensive evaluation has been performed which included echocardiogram which showed preserved left ventricle ejection fraction, he does have mild aortic stenosis which clearly is not responsible for his symptomatology.  However shortness of breath still persist.  He thinks he is simply getting hold he also described to have a heartburn.  Heartburn happened in different situations sometimes when he walks that of course make me worry about having coronary artery disease.  He denies have any palpitations no swelling of lower extremities  Past Medical History:  Diagnosis Date   Chronic GERD    Depression    Hyperlipemia    Major depressive disorder, single episode, moderate (HCC)     Past Surgical History:  Procedure Laterality Date   ANKLE SURGERY      Current Medications: Current Meds  Medication Sig   aspirin 81 MG EC tablet Take by mouth.   atorvastatin (LIPITOR) 40 MG tablet TAKE ONE TABLET BY MOUTH DAILY (Patient taking differently: Take 40 mg by mouth daily.)   meloxicam (MOBIC) 7.5 MG tablet TAKE ONE TABLET BY MOUTH DAILY WITH FOOD (Patient taking differently: Take 7.5 mg by mouth daily.)   Multiple Vitamins-Minerals (MULTI COMPLETE PO) Take by mouth.   Omega-3 Fatty Acids (FISH OIL) 1000 MG CAPS Take by mouth.   omeprazole (PRILOSEC) 40 MG capsule TAKE ONE CAPSULE BY MOUTH DAILY (Patient taking differently: Take 40 mg by mouth daily.)   sertraline (ZOLOFT) 100 MG tablet TAKE ONE TABLET BY MOUTH DAILY (Patient taking differently: Take 100 mg by  mouth daily.)     Allergies:   Aleve [naproxen], Codeine, and Morphine   Social History   Socioeconomic History   Marital status: Married    Spouse name: Not on file   Number of children: Not on file   Years of education: Not on file   Highest education level: Not on file  Occupational History   Occupation: truck driver  Tobacco Use   Smoking status: Former    Types: Cigarettes    Quit date: 2003    Years since quitting: 20.1   Smokeless tobacco: Never  Vaping Use   Vaping Use: Never used  Substance and Sexual Activity   Alcohol use: Never   Drug use: Never   Sexual activity: Not on file  Other Topics Concern   Not on file  Social History Narrative   Not on file   Social Determinants of Health   Financial Resource Strain: Not on file  Food Insecurity: Not on file  Transportation Needs: Not on file  Physical Activity: Not on file  Stress: Not on file  Social Connections: Not on file     Family History: The patient's family history includes CAD in his father, mother, and sister; CVA in his mother; Cancer in his sister. ROS:   Please see the history of present illness.    All 14 point review of systems negative except as described per history of present illness  EKGs/Labs/Other Studies Reviewed:  Recent Labs: 07/12/2021: TSH 3.310 10/25/2021: ALT 18; BUN 16; Creatinine, Ser 1.01; Hemoglobin 11.7; Platelets 199; Potassium 4.5; Sodium 142  Recent Lipid Panel    Component Value Date/Time   CHOL 168 10/25/2021 0820   TRIG 134 10/25/2021 0820   HDL 62 10/25/2021 0820   CHOLHDL 2.7 10/25/2021 0820   LDLCALC 83 10/25/2021 0820    Physical Exam:    VS:  BP 136/74 (BP Location: Left Arm, Patient Position: Sitting)    Pulse 66    Ht 5\' 11"  (1.803 m)    Wt 234 lb 9.6 oz (106.4 kg)    SpO2 96%    BMI 32.72 kg/m     Wt Readings from Last 3 Encounters:  12/19/21 234 lb 9.6 oz (106.4 kg)  10/25/21 234 lb (106.1 kg)  10/05/21 234 lb (106.1 kg)     GEN:  Well  nourished, well developed in no acute distress HEENT: Normal NECK: No JVD; No carotid bruits LYMPHATICS: No lymphadenopathy CARDIAC: RRR, soft systolic ejection murmur grade 1/6 best heard right upper portion of the sternum, no rubs, no gallops RESPIRATORY:  Clear to auscultation without rales, wheezing or rhonchi  ABDOMEN: Soft, non-tender, non-distended MUSCULOSKELETAL:  No edema; No deformity  SKIN: Warm and dry LOWER EXTREMITIES: no swelling NEUROLOGIC:  Alert and oriented x 3 PSYCHIATRIC:  Normal affect   ASSESSMENT:    1. Dyspnea on exertion   2. Atypical chest pain   3. Mixed hyperlipidemia   4. History of COVID-19   5. Nonrheumatic aortic valve stenosis    PLAN:    In order of problems listed above:  Dyspnea on exertion: So far no cardiac explanation however his symptomatology getting worrisome and more about potentially angina equivalent on top of that today he mentioned to me having GERD/heartburn which happens sometimes when he walks.  I think we must evaluate him for coronary artery disease.  I think the best test to do would be coronary CT angio which also will allow [place] to look at lung parenchyma.  We will schedule him to have the test Mixed dyslipidemia I did review his K PN which show me his LDL of 83 HDL of 64 this is from January of this year he is on Lipitor 40 which is high intensity statin which I will continue.  He is already on aspirin which I will continue. Nonrheumatic arctic valve stenosis only mild on echocardiogram continue monitoring   Medication Adjustments/Labs and Tests Ordered: Current medicines are reviewed at length with the patient today.  Concerns regarding medicines are outlined above.  No orders of the defined types were placed in this encounter.  Medication changes: No orders of the defined types were placed in this encounter.   Signed, Park Liter, MD, [facility] 12/19/2021 11:08 AM    [place]

## 2021-12-25 ENCOUNTER — Other Ambulatory Visit: Payer: Self-pay

## 2021-12-25 DIAGNOSIS — K219 Gastro-esophageal reflux disease without esophagitis: Secondary | ICD-10-CM

## 2021-12-25 MED ORDER — OMEPRAZOLE 40 MG PO CPDR: 40.0000 mg | DELAYED_RELEASE_CAPSULE | Freq: Every day | ORAL | 1 refills | Status: DC

## 2021-12-28 DIAGNOSIS — R0789 Other chest pain: Secondary | ICD-10-CM | POA: Diagnosis not present

## 2021-12-28 DIAGNOSIS — Z8616 Personal history of COVID-19: Secondary | ICD-10-CM | POA: Diagnosis not present

## 2021-12-28 DIAGNOSIS — R0609 Other forms of dyspnea: Secondary | ICD-10-CM | POA: Diagnosis not present

## 2021-12-28 DIAGNOSIS — E782 Mixed hyperlipidemia: Secondary | ICD-10-CM | POA: Diagnosis not present

## 2021-12-28 DIAGNOSIS — I350 Nonrheumatic aortic (valve) stenosis: Secondary | ICD-10-CM | POA: Diagnosis not present

## 2022-01-01 ENCOUNTER — Telehealth (HOSPITAL_COMMUNITY): Payer: Self-pay | Admitting: Emergency Medicine

## 2022-01-01 NOTE — Telephone Encounter (Signed)
Reaching out to patient to offer assistance regarding upcoming cardiac imaging study; pt verbalizes understanding of appt date/time, parking situation and where to check in, pre-test NPO status and medications ordered, and verified current allergies; name and call back number provided for further questions should they arise ?Marchia Bond RN Navigator Cardiac Imaging ?[facility] ?[phone] office ?[phone] cell ? ?Pt states he got labs drawn last week- will reach out for results ?Denies iv issues ?'100mg'$  metoprolol tartrate  ?Arrival 2:00 ? ?

## 2022-01-04 ENCOUNTER — Other Ambulatory Visit: Payer: Self-pay | Admitting: Cardiology

## 2022-01-04 ENCOUNTER — Ambulatory Visit (HOSPITAL_COMMUNITY)
Admission: RE | Admit: 2022-01-04 | Discharge: 2022-01-04 | Disposition: A | Discharge status: Home / Self Care | Payer: Medicare Other | Source: Ambulatory Visit | Attending: Cardiology | Admitting: Cardiology

## 2022-01-04 ENCOUNTER — Other Ambulatory Visit: Payer: Self-pay

## 2022-01-04 DIAGNOSIS — R0789 Other chest pain: Secondary | ICD-10-CM | POA: Diagnosis not present

## 2022-01-04 DIAGNOSIS — R0609 Other forms of dyspnea: Secondary | ICD-10-CM | POA: Diagnosis not present

## 2022-01-04 DIAGNOSIS — R931 Abnormal findings on diagnostic imaging of heart and coronary circulation: Secondary | ICD-10-CM | POA: Diagnosis not present

## 2022-01-04 DIAGNOSIS — Z8616 Personal history of COVID-19: Secondary | ICD-10-CM | POA: Diagnosis not present

## 2022-01-04 DIAGNOSIS — I2510 Atherosclerotic heart disease of native coronary artery without angina pectoris: Secondary | ICD-10-CM | POA: Diagnosis not present

## 2022-01-04 DIAGNOSIS — E782 Mixed hyperlipidemia: Secondary | ICD-10-CM | POA: Insufficient documentation

## 2022-01-04 DIAGNOSIS — I350 Nonrheumatic aortic (valve) stenosis: Secondary | ICD-10-CM | POA: Diagnosis not present

## 2022-01-04 DIAGNOSIS — R072 Precordial pain: Secondary | ICD-10-CM | POA: Diagnosis not present

## 2022-01-04 MED ORDER — NITROGLYCERIN 0.4 MG SL SUBL
0.8000 mg | SUBLINGUAL_TABLET | Freq: Once | SUBLINGUAL | Status: AC
  Administered 2022-01-04: 0.8 mg via SUBLINGUAL

## 2022-01-04 MED ORDER — NITROGLYCERIN 0.4 MG SL SUBL
SUBLINGUAL_TABLET | SUBLINGUAL | Status: AC
  Filled 2022-01-04: qty 2

## 2022-01-04 MED ORDER — IOHEXOL 350 MG/ML SOLN
95.0000 mL | Freq: Once | INTRAVENOUS | Status: AC | PRN
  Administered 2022-01-04: 95 mL via INTRAVENOUS

## 2022-01-04 NOTE — Progress Notes (Signed)
c 

## 2022-01-05 ENCOUNTER — Ambulatory Visit (HOSPITAL_COMMUNITY)
Admission: RE | Admit: 2022-01-05 | Discharge: 2022-01-05 | Disposition: A | Discharge status: Home / Self Care | Payer: Medicare Other | Source: Ambulatory Visit | Attending: Cardiology | Admitting: Cardiology

## 2022-01-05 DIAGNOSIS — Z8616 Personal history of COVID-19: Secondary | ICD-10-CM | POA: Diagnosis not present

## 2022-01-05 DIAGNOSIS — R0609 Other forms of dyspnea: Secondary | ICD-10-CM | POA: Diagnosis not present

## 2022-01-05 DIAGNOSIS — R0789 Other chest pain: Secondary | ICD-10-CM | POA: Diagnosis not present

## 2022-01-05 DIAGNOSIS — R072 Precordial pain: Secondary | ICD-10-CM | POA: Diagnosis not present

## 2022-01-05 DIAGNOSIS — E782 Mixed hyperlipidemia: Secondary | ICD-10-CM | POA: Diagnosis not present

## 2022-01-05 DIAGNOSIS — R931 Abnormal findings on diagnostic imaging of heart and coronary circulation: Secondary | ICD-10-CM | POA: Diagnosis not present

## 2022-01-07 LAB — BASIC METABOLIC PANEL
BUN/Creatinine Ratio: 20 (ref 10–24)
BUN: 22 mg/dL (ref 8–27)
Calcium: 9.8 mg/dL (ref 8.6–10.2)
Chloride: 106 mmol/L (ref 96–106)
Creatinine, Ser: 1.11 mg/dL (ref 0.76–1.27)
Glucose: 91 mg/dL (ref 70–99)
Potassium: 4.7 mmol/L (ref 3.5–5.2)
Sodium: 142 mmol/L (ref 134–144)
eGFR: 71 mL/min/{1.73_m2} (ref >59)

## 2022-01-10 DIAGNOSIS — D509 Iron deficiency anemia, unspecified: Secondary | ICD-10-CM | POA: Diagnosis not present

## 2022-01-10 DIAGNOSIS — K2930 Chronic superficial gastritis without bleeding: Secondary | ICD-10-CM | POA: Diagnosis not present

## 2022-01-10 DIAGNOSIS — D649 Anemia, unspecified: Secondary | ICD-10-CM | POA: Diagnosis not present

## 2022-01-10 DIAGNOSIS — K3189 Other diseases of stomach and duodenum: Secondary | ICD-10-CM | POA: Diagnosis not present

## 2022-01-10 DIAGNOSIS — R1310 Dysphagia, unspecified: Secondary | ICD-10-CM | POA: Diagnosis not present

## 2022-01-10 DIAGNOSIS — K5730 Diverticulosis of large intestine without perforation or abscess without bleeding: Secondary | ICD-10-CM | POA: Diagnosis not present

## 2022-01-10 DIAGNOSIS — K640 First degree hemorrhoids: Secondary | ICD-10-CM | POA: Diagnosis not present

## 2022-01-10 DIAGNOSIS — K2270 Barrett's esophagus without dysplasia: Secondary | ICD-10-CM | POA: Diagnosis not present

## 2022-01-10 LAB — HM COLONOSCOPY

## 2022-01-30 ENCOUNTER — Encounter: Payer: Self-pay | Admitting: Physician Assistant

## 2022-02-13 DIAGNOSIS — D649 Anemia, unspecified: Secondary | ICD-10-CM | POA: Diagnosis not present

## 2022-02-13 DIAGNOSIS — K2270 Barrett's esophagus without dysplasia: Secondary | ICD-10-CM | POA: Diagnosis not present

## 2022-04-02 ENCOUNTER — Ambulatory Visit: Payer: Medicare Other | Admitting: Cardiology

## 2022-04-23 ENCOUNTER — Ambulatory Visit (INDEPENDENT_AMBULATORY_CARE_PROVIDER_SITE_OTHER): Payer: Medicare Other | Admitting: Nurse Practitioner

## 2022-04-23 ENCOUNTER — Encounter: Payer: Self-pay | Admitting: Nurse Practitioner

## 2022-04-23 VITALS — BP 128/68 | HR 78 | Temp 97.1°F | Ht 71.0 in | Wt 226.0 lb

## 2022-04-23 DIAGNOSIS — H9192 Unspecified hearing loss, left ear: Secondary | ICD-10-CM | POA: Diagnosis not present

## 2022-04-23 DIAGNOSIS — H6122 Impacted cerumen, left ear: Secondary | ICD-10-CM | POA: Diagnosis not present

## 2022-04-23 DIAGNOSIS — T162XXA Foreign body in left ear, initial encounter: Secondary | ICD-10-CM

## 2022-04-23 MED ORDER — NEOMYCIN-POLYMYXIN-HC 3.5-10000-1 OT SOLN: 4.0000 [drp] | Freq: Four times a day (QID) | OTIC | 0 refills | Status: DC

## 2022-04-23 NOTE — Progress Notes (Signed)
Acute Office Visit  Subjective:    Patient ID: Angel Orr, male    DOB: 1950/11/22, 71 y.o.   MRN: [id number]  CC: Left ear pain  HPI: Patient is in today for Ear Pain: Patient presents with left ear pain and decreased hearing.Symptoms began 1 week ago and are gradually improving since that time.Treatment has included Tylenol. Denies recent URI symptoms or allergic rhinitis. States he has hearing loss, wears hearing aid.    Past Medical History:  Diagnosis Date   Chronic GERD    Depression    Hyperlipemia    Major depressive disorder, single episode, moderate (HCC)     Past Surgical History:  Procedure Laterality Date   ANKLE SURGERY      Family History  Problem Relation Age of Onset   CVA Mother    CAD Mother    CAD Father    Cancer Sister    CAD Sister     Social History   Socioeconomic History   Marital status: Married    Spouse name: Not on file   Number of children: Not on file   Years of education: Not on file   Highest education level: Not on file  Occupational History   Occupation: truck driver  Tobacco Use   Smoking status: Former    Types: Cigarettes    Quit date: 2003    Years since quitting: 20.5   Smokeless tobacco: Never  Vaping Use   Vaping Use: Never used  Substance and Sexual Activity   Alcohol use: Never   Drug use: Never   Sexual activity: Not on file  Other Topics Concern   Not on file  Social History Narrative   Not on file   Social Determinants of Health   Financial Resource Strain: Not on file  Food Insecurity: Not on file  Transportation Needs: Not on file  Physical Activity: Not on file  Stress: Not on file  Social Connections: Not on file  Intimate Partner Violence: Not on file    Outpatient Medications Prior to Visit  Medication Sig Dispense Refill   aspirin 81 MG EC tablet Take by mouth.     atorvastatin (LIPITOR) 40 MG tablet TAKE ONE TABLET BY MOUTH DAILY (Patient taking differently: Take 40 mg by mouth  daily.) 90 tablet 1   meloxicam (MOBIC) 7.5 MG tablet TAKE ONE TABLET BY MOUTH DAILY WITH FOOD (Patient taking differently: Take 7.5 mg by mouth daily.) 90 tablet 1   metoprolol tartrate (LOPRESSOR) 100 MG tablet Take 1 tablet (100 mg total) by mouth once for 1 dose. Please take 2 hours before CT 1 tablet 0   Multiple Vitamins-Minerals (MULTI COMPLETE PO) Take by mouth.     Omega-3 Fatty Acids (FISH OIL) 1000 MG CAPS Take by mouth.     omeprazole (PRILOSEC) 40 MG capsule Take 1 capsule (40 mg total) by mouth daily. 90 capsule 1   sertraline (ZOLOFT) 100 MG tablet TAKE ONE TABLET BY MOUTH DAILY (Patient taking differently: Take 100 mg by mouth daily.) 90 tablet 1   No facility-administered medications prior to visit.    Allergies  Allergen Reactions   Aleve [Naproxen]    Codeine    Morphine     Review of Systems  Constitutional:  Negative for fatigue and fever.  HENT:  Positive for ear pain (left) and hearing loss. Negative for congestion, ear discharge, postnasal drip, rhinorrhea, sinus pressure and sinus pain.   Allergic/Immunologic: Negative for environmental allergies.  Objective:    Physical Exam Vitals reviewed.  Constitutional:      Appearance: Normal appearance.  HENT:     Right Ear: Tympanic membrane normal.     Left Ear: There is impacted cerumen.     Ears:     Comments: Left ear plastic foreign body (plastic from hearing aid) and impacted cerumen Neurological:     Mental Status: He is alert.    .BP 128/68   Pulse 78   Temp (!) 97.1 F (36.2 C)   Ht $R'5\' 11"'Lz$  (1.803 m)   Wt 226 lb (102.5 kg)   SpO2 98%   BMI 31.52 kg/m   Wt Readings from Last 3 Encounters:  04/23/22 226 lb (102.5 kg)  12/19/21 234 lb 9.6 oz (106.4 kg)  10/25/21 234 lb (106.1 kg)    Health Maintenance Due  Topic Date Due   TETANUS/TDAP  Never done   Fecal DNA (Cologuard)  03/31/2021    Lab Results  Component Value Date   TSH 3.310 07/12/2021   Lab Results  Component Value  Date   WBC 6.3 10/25/2021   HGB 11.7 (L) 10/25/2021   HCT 35.4 (L) 10/25/2021   MCV 86 10/25/2021   PLT 199 10/25/2021   Lab Results  Component Value Date   NA 142 12/28/2021   K 4.7 12/28/2021   CO2 CANCELED 12/28/2021   GLUCOSE 91 12/28/2021   BUN 22 12/28/2021   CREATININE 1.11 12/28/2021   BILITOT 0.2 10/25/2021   ALKPHOS 80 10/25/2021   AST 20 10/25/2021   ALT 18 10/25/2021   PROT 6.3 10/25/2021   ALBUMIN 4.2 10/25/2021   CALCIUM 9.8 12/28/2021   EGFR 71 12/28/2021   Lab Results  Component Value Date   CHOL 168 10/25/2021   Lab Results  Component Value Date   HDL 62 10/25/2021   Lab Results  Component Value Date   LDLCALC 83 10/25/2021   Lab Results  Component Value Date   TRIG 134 10/25/2021   Lab Results  Component Value Date   CHOLHDL 2.7 10/25/2021        Assessment & Plan:   1. Left ear impacted cerumen - Ear wax removal  2. Hearing loss of left ear, unspecified hearing loss type - Ear wax removal  3. Acute foreign body of left ear, initial encounter - neomycin-polymyxin-hydrocortisone (CORTISPORIN) OTIC solution; Place 4 drops into the left ear 4 (four) times daily.  Dispense: 10 mL; Refill: 0    Use antibiotic drops to left ear four times daily for 3 days Follow-up as needed     Follow-up: PRN  An After Visit Summary was printed and given to the patient.  I, Rip Harbour, NP, have reviewed all documentation for this visit. The documentation on 04/23/22 for the exam, diagnosis, procedures, and orders are all accurate and complete.    Signed, Rip Harbour, NP [place] [phone]

## 2022-04-23 NOTE — Patient Instructions (Addendum)
Use antibiotic drops to left ear four times daily for 3 days Follow-up as needed  Earwax Buildup, Adult The ears produce a substance called earwax that helps keep bacteria out of the ear and protects the skin in the ear canal. Occasionally, earwax can build up in the ear and cause discomfort or hearing loss. What are the causes? This condition is caused by a buildup of earwax. Ear canals are self-cleaning. Ear wax is made in the outer part of the ear canal and generally falls out in small amounts over time. When the self-cleaning mechanism is not working, earwax builds up and can cause decreased hearing and discomfort. Attempting to clean ears with cotton swabs can push the earwax deep into the ear canal and cause decreased hearing and pain. What increases the risk? This condition is more likely to develop in people who: Clean their ears often with cotton swabs. Pick at their ears. Use earplugs or in-ear headphones often, or wear hearing aids. The following factors may also make you more likely to develop this condition: Being male. Being of older age. Naturally producing more earwax. Having narrow ear canals. Having earwax that is overly thick or sticky. Having excess hair in the ear canal. Having eczema. Being dehydrated. What are the signs or symptoms? Symptoms of this condition include: Reduced or muffled hearing. A feeling of fullness in the ear or feeling that the ear is plugged. Fluid coming from the ear. Ear pain or an itchy ear. Ringing in the ear. Coughing. Balance problems. An obvious piece of earwax that can be seen inside the ear canal. How is this diagnosed? This condition may be diagnosed based on: Your symptoms. Your medical history. An ear exam. During the exam, your health care provider will look into your ear with an instrument called an otoscope. You may have tests, including a hearing test. How is this treated? This condition may be treated by: Using ear  drops to soften the earwax. Having the earwax removed by a health care provider. The health care provider may: Flush the ear with water. Use an instrument that has a loop on the end (curette). Use a suction device. Having surgery to remove the wax buildup. This may be done in severe cases. Follow these instructions at home:  Take over-the-counter and prescription medicines only as told by your health care provider. Do not put any objects, including cotton swabs, into your ear. You can clean the opening of your ear canal with a washcloth or facial tissue. Follow instructions from your health care provider about cleaning your ears. Do not overclean your ears. Drink enough fluid to keep your urine pale yellow. This will help to thin the earwax. Keep all follow-up visits as told. If earwax builds up in your ears often or if you use hearing aids, consider seeing your health care provider for routine, preventive ear cleanings. Ask your health care provider how often you should schedule your cleanings. If you have hearing aids, clean them according to instructions from the manufacturer and your health care provider. Contact a health care provider if: You have ear pain. You develop a fever. You have pus or other fluid coming from your ear. You have hearing loss. You have ringing in your ears that does not go away. You feel like the room is spinning (vertigo). Your symptoms do not improve with treatment. Get help right away if: You have bleeding from the affected ear. You have severe ear pain. Summary Earwax can build up in  the ear and cause discomfort or hearing loss. The most common symptoms of this condition include reduced or muffled hearing, a feeling of fullness in the ear, or feeling that the ear is plugged. This condition may be diagnosed based on your symptoms, your medical history, and an ear exam. This condition may be treated by using ear drops to soften the earwax or by having the  earwax removed by a health care provider. Do not put any objects, including cotton swabs, into your ear. You can clean the opening of your ear canal with a washcloth or facial tissue. This information is not intended to replace advice given to you by your health care provider. Make sure you discuss any questions you have with your health care provider. Document Revised: 01/26/2020 Document Reviewed: 01/26/2020 Elsevier Patient Education  [place].  Hearing Loss Hearing loss is a partial or total loss of the ability to hear. This can be temporary or permanent, and it can happen in one or both ears. There are two types of hearing loss. You can have just one type or both types. You may have a problem with: Damage to your hearing nerves (sensorineural hearing loss). This type of hearing loss is more likely to be permanent. A hearing aid is often the best treatment. Sound getting to your inner ear (conductive hearing loss). This type of hearing loss can usually be treated medically or surgically. Medical care is necessary to treat hearing loss properly and to prevent the condition from getting worse. Your hearing may partially or completely come back, depending on what caused your hearing loss and how severe it is. In some cases, hearing loss is permanent. What are the causes? Common causes of hearing loss include: Too much wax in the ear canal. Infection of the ear canal or middle ear. Fluid in the middle ear. Injury to the ear or surrounding area. An object stuck in the ear. A history of prolonged exposure to loud sounds, such as music. Less common causes of hearing loss include: Tumors in the ear. Viral or bacterial infections, such as meningitis. A hole in the eardrum (perforated eardrum). Problems with the hearing nerve that sends signals between the brain and the ear. Certain medicines. What are the signs or symptoms? Symptoms of this condition may include: Difficulty telling  the difference between sounds. Difficulty following a conversation when there is background noise. Lack of response to sounds in your environment. This may be most noticeable when you do not respond to startling sounds. Needing to turn up the volume on the television, radio, or other devices. Ringing in the ears. Dizziness. How is this diagnosed? This condition is diagnosed based on: A physical exam. A hearing test (audiometry). The test will be performed by a hearing specialist (audiologist). Imaging tests, such as an MRI or CT scan. You may also be referred to an ear, nose, and throat (ENT) specialist (otolaryngologist). How is this treated? Treatment for hearing loss may include: Earwax removal. Medicines to treat or prevent infection (antibiotics). Medicines to reduce inflammation (corticosteroids). Hearing aids or assistive listening devices. Follow these instructions at home: If you were prescribed an antibiotic medicine, take it as told by your health care provider. Do not stop taking the antibiotic even if you start to feel better. Take over-the-counter and prescription medicines only as told by your health care provider. Avoid loud noises. Use hearing protection if you are exposed to loud noises or work in a noisy environment. Return to your normal  activities as told by your health care provider. Ask your health care provider what activities are safe for you. Keep all follow-up visits. This is important. Contact a health care provider if: You feel dizzy. You develop new symptoms. You vomit or feel nauseous. You have a fever. Get help right away if: You develop sudden changes in your vision. You have severe ear pain. You have new or increased weakness. You have a severe headache. Summary Hearing loss is a decreased ability to hear sounds around you. It can be temporary or permanent. Treatment will depend on the cause of your hearing loss. It may include earwax removal,  medicines, or a hearing aid. Your hearing may partially or completely come back, depending on what caused your hearing loss and how severe it is. Keep all follow-up visits. This is important. This information is not intended to replace advice given to you by your health care provider. Make sure you discuss any questions you have with your health care provider. Document Revised: 08/17/2021 Document Reviewed: 08/17/2021 Elsevier Patient Education  [place].

## 2022-06-11 ENCOUNTER — Ambulatory Visit: Payer: Medicare Other | Admitting: Physician Assistant

## 2022-06-14 ENCOUNTER — Encounter: Payer: Self-pay | Admitting: Physician Assistant

## 2022-06-14 ENCOUNTER — Ambulatory Visit (INDEPENDENT_AMBULATORY_CARE_PROVIDER_SITE_OTHER): Payer: Medicare Other | Admitting: Physician Assistant

## 2022-06-14 VITALS — BP 108/68 | HR 69 | Temp 98.3°F | Ht 71.0 in | Wt 224.0 lb

## 2022-06-14 DIAGNOSIS — F3289 Other specified depressive episodes: Secondary | ICD-10-CM | POA: Diagnosis not present

## 2022-06-14 DIAGNOSIS — F419 Anxiety disorder, unspecified: Secondary | ICD-10-CM | POA: Diagnosis not present

## 2022-06-14 DIAGNOSIS — K219 Gastro-esophageal reflux disease without esophagitis: Secondary | ICD-10-CM | POA: Diagnosis not present

## 2022-06-14 DIAGNOSIS — M79621 Pain in right upper arm: Secondary | ICD-10-CM | POA: Diagnosis not present

## 2022-06-14 DIAGNOSIS — E782 Mixed hyperlipidemia: Secondary | ICD-10-CM

## 2022-06-14 MED ORDER — SERTRALINE HCL 100 MG PO TABS: 100.0000 mg | ORAL_TABLET | Freq: Every day | ORAL | 1 refills | Status: DC

## 2022-06-14 MED ORDER — OMEPRAZOLE 40 MG PO CPDR: 40.0000 mg | DELAYED_RELEASE_CAPSULE | Freq: Every day | ORAL | 1 refills | Status: DC

## 2022-06-14 MED ORDER — ATORVASTATIN CALCIUM 40 MG PO TABS: 40.0000 mg | ORAL_TABLET | Freq: Every day | ORAL | 1 refills | Status: DC

## 2022-06-14 MED ORDER — MELOXICAM 7.5 MG PO TABS: 7.5000 mg | ORAL_TABLET | Freq: Every day | ORAL | 1 refills | Status: DC

## 2022-06-14 NOTE — Progress Notes (Signed)
Subjective:  Patient ID: Angel Orr, male    DOB: 16-Nov-1950  Age: 71 y.o. MRN: [id number]  Chief Complaint  Patient presents with   Hyperlipidemia    Hyperlipidemia    Mixed hyperlipidemia  Pt presents with hyperlipidemia.  Compliance with treatment has been goodThe patient is compliant with medications, maintains a low cholesterol diet , follows up as directed , and maintains an exercise regimen . The patient denies experiencing any hypercholesterolemia related symptoms.  Currently on fish oil and lipitor '40mg'$  qd  Pt with history of GERD - stable on prilosec '40mg'$  qd Pt states he did see GI and had a normal colonoscopy in March 2023- will sign for records  Pt with history of depression - stable on zoloft '100mg'$  qd - voices no other problems or concerns  Pt was having some mild exertional dyspnea - has had cardiology evaluation and an echocardiogram - per pt test was normal - he is due to schedule  follow up with Dr Agustin Cree and will call their office for appt  Pt complains of right upper arm tenderness for several weeks - cannot recall injury or trauma - is sore to touch and with movements   Current Outpatient Medications on File Prior to Visit  Medication Sig Dispense Refill   aspirin 81 MG EC tablet Take by mouth.     Multiple Vitamins-Minerals (MULTI COMPLETE PO) Take by mouth.     Omega-3 Fatty Acids (FISH OIL) 1000 MG CAPS Take by mouth.     No current facility-administered medications on file prior to visit.   Past Medical History:  Diagnosis Date   Chronic GERD    Depression    Hyperlipemia    Major depressive disorder, single episode, moderate (HCC)    Past Surgical History:  Procedure Laterality Date   ANKLE SURGERY      Family History  Problem Relation Age of Onset   CVA Mother    CAD Mother    CAD Father    Cancer Sister    CAD Sister    Social History   Socioeconomic History   Marital status: Married    Spouse name: Not on file   Number of  children: Not on file   Years of education: Not on file   Highest education level: Not on file  Occupational History   Occupation: truck driver  Tobacco Use   Smoking status: Former    Types: Cigarettes    Quit date: 2003    Years since quitting: 20.6   Smokeless tobacco: Never  Vaping Use   Vaping Use: Never used  Substance and Sexual Activity   Alcohol use: Never   Drug use: Never   Sexual activity: Not on file  Other Topics Concern   Not on file  Social History Narrative   Not on file   Social Determinants of Health   Financial Resource Strain: Not on file  Food Insecurity: Not on file  Transportation Needs: Not on file  Physical Activity: Not on file  Stress: Not on file  Social Connections: Not on file   CONSTITUTIONAL: Negative for chills, fatigue, fever, unintentional weight gain and unintentional weight loss.  E/N/T: Negative for ear pain, nasal congestion and sore throat.  CARDIOVASCULAR: Negative for chest pain, dizziness, palpitations and pedal edema.  RESPIRATORY: Negative for recent cough and dyspnea.  GASTROINTESTINAL: Negative for abdominal pain, acid reflux symptoms, constipation, diarrhea, nausea and vomiting.  MSK: see HPI INTEGUMENTARY: Negative for rash.  NEUROLOGICAL: Negative for dizziness  and headaches.  PSYCHIATRIC: Negative for sleep disturbance and to question depression screen.  Negative for depression, negative for anhedonia.       Objective:  PHYSICAL EXAM:   VS: BP 108/68 (BP Location: Left Arm, Patient Position: Sitting, Cuff Size: Large)   Pulse 69   Temp 98.3 F (36.8 C) (Temporal)   Ht '5\' 11"'$  (1.803 m)   Wt 224 lb (101.6 kg)   SpO2 99%   BMI 31.24 kg/m   GEN: Well nourished, well developed, in no acute distress   Cardiac: RRR; no murmurs, rubs, or gallops,no edema -  Respiratory:  normal respiratory rate and pattern with no distress - normal breath sounds with no rales, rhonchi, wheezes or rubs GI: normal bowel sounds, no  masses or tenderness MS: right upper arm tender to palpation  Skin: warm and dry, no rash   Psych: euthymic mood, appropriate affect and demeanor  Lab Results  Component Value Date   WBC 6.3 10/25/2021   HGB 11.7 (L) 10/25/2021   HCT 35.4 (L) 10/25/2021   PLT 199 10/25/2021   GLUCOSE 91 12/28/2021   CHOL 168 10/25/2021   TRIG 134 10/25/2021   HDL 62 10/25/2021   LDLCALC 83 10/25/2021   ALT 18 10/25/2021   AST 20 10/25/2021   NA 142 12/28/2021   K 4.7 12/28/2021   CL 106 12/28/2021   CREATININE 1.11 12/28/2021   BUN 22 12/28/2021   CO2 CANCELED 12/28/2021   TSH 3.310 07/12/2021      Assessment & Plan:   Problem List Items Addressed This Visit       Digestive   Gastroesophageal reflux disease without esophagitis Continue current meds     Other   Mixed hyperlipidemia - Primary   Relevant Orders   CBC with Differential/Platelet   Comprehensive metabolic panel   Lipid panel Continue meds and watch diet   Depression Continue zoloft as directed  Right arm pain Take meloxicam as directed Recommend elbow strap for comfort  .  Meds ordered this encounter  Medications   atorvastatin (LIPITOR) 40 MG tablet    Sig: Take 1 tablet (40 mg total) by mouth daily.    Dispense:  90 tablet    Refill:  1    This prescription was filled on 10/03/2021. Any refills authorized will be placed on file.    Order Specific Question:   Supervising Provider    Answer:   Shelton Silvas   omeprazole (PRILOSEC) 40 MG capsule    Sig: Take 1 capsule (40 mg total) by mouth daily.    Dispense:  90 capsule    Refill:  1    This prescription was filled on 10/03/2021. Any refills authorized will be placed on file.    Order Specific Question:   Supervising Provider    Answer:   Rochel Brome [[id number]]   sertraline (ZOLOFT) 100 MG tablet    Sig: Take 1 tablet (100 mg total) by mouth daily.    Dispense:  90 tablet    Refill:  1    This prescription was filled on 10/03/2021. Any  refills authorized will be placed on file.    Order Specific Question:   Supervising Provider    Answer:   Shelton Silvas   meloxicam (MOBIC) 7.5 MG tablet    Sig: Take 1 tablet (7.5 mg total) by mouth daily.    Dispense:  90 tablet    Refill:  1    Order Specific Question:  Supervising Provider    AnswerRochel Brome [[id number]]    Orders Placed This Encounter  Procedures   CBC with Differential/Platelet   Comprehensive metabolic panel   TSH   Lipid panel     Follow-up: Return in about 6 months (around 12/15/2022) for chronic fasting follow up.  An After Visit Summary was printed and given to the patient.  Yetta Flock [facility] [phone]

## 2022-06-15 LAB — CBC WITH DIFFERENTIAL/PLATELET
Basophils Absolute: 0.1 10*3/uL (ref 0.0–0.2)
Basos: 1 %
EOS (ABSOLUTE): 0.2 10*3/uL (ref 0.0–0.4)
Eos: 4 %
Hematocrit: 37.1 % — ABNORMAL LOW (ref 37.5–51.0)
Hemoglobin: 12.5 g/dL — ABNORMAL LOW (ref 13.0–17.7)
Immature Grans (Abs): 0 10*3/uL (ref 0.0–0.1)
Immature Granulocytes: 0 %
Lymphocytes Absolute: 2 10*3/uL (ref 0.7–3.1)
Lymphs: 33 %
MCH: 29.4 pg (ref 26.6–33.0)
MCHC: 33.7 g/dL (ref 31.5–35.7)
MCV: 87 fL (ref 79–97)
Monocytes Absolute: 0.5 10*3/uL (ref 0.1–0.9)
Monocytes: 8 %
Neutrophils Absolute: 3.4 10*3/uL (ref 1.4–7.0)
Neutrophils: 54 %
Platelets: 231 10*3/uL (ref 150–450)
RBC: 4.25 x10E6/uL (ref 4.14–5.80)
RDW: 13.5 % (ref 11.6–15.4)
WBC: 6.1 10*3/uL (ref 3.4–10.8)

## 2022-06-15 LAB — COMPREHENSIVE METABOLIC PANEL
ALT: 24 IU/L (ref 0–44)
AST: 21 IU/L (ref 0–40)
Albumin/Globulin Ratio: 1.8 (ref 1.2–2.2)
Albumin: 4.5 g/dL (ref 3.8–4.8)
Alkaline Phosphatase: 83 IU/L (ref 44–121)
BUN/Creatinine Ratio: 16 (ref 10–24)
BUN: 17 mg/dL (ref 8–27)
Bilirubin Total: 0.3 mg/dL (ref 0.0–1.2)
CO2: 21 mmol/L (ref 20–29)
Calcium: 9.5 mg/dL (ref 8.6–10.2)
Chloride: 105 mmol/L (ref 96–106)
Creatinine, Ser: 1.09 mg/dL (ref 0.76–1.27)
Globulin, Total: 2.5 g/dL (ref 1.5–4.5)
Glucose: 93 mg/dL (ref 70–99)
Potassium: 4.8 mmol/L (ref 3.5–5.2)
Sodium: 141 mmol/L (ref 134–144)
Total Protein: 7 g/dL (ref 6.0–8.5)
eGFR: 73 mL/min/{1.73_m2} (ref >59)

## 2022-06-15 LAB — LIPID PANEL
Chol/HDL Ratio: 3.3 ratio (ref 0.0–5.0)
Cholesterol, Total: 194 mg/dL (ref 100–199)
HDL: 58 mg/dL (ref >39)
LDL Chol Calc (NIH): 103 mg/dL — ABNORMAL HIGH (ref 0–99)
Triglycerides: 194 mg/dL — ABNORMAL HIGH (ref 0–149)
VLDL Cholesterol Cal: 33 mg/dL (ref 5–40)

## 2022-06-15 LAB — CARDIOVASCULAR RISK ASSESSMENT

## 2022-06-15 LAB — TSH: TSH: 2.75 u[IU]/mL (ref 0.450–4.500)

## 2022-06-26 ENCOUNTER — Telehealth: Payer: Self-pay | Admitting: Family Medicine

## 2022-06-26 NOTE — Telephone Encounter (Signed)
Called to try and get AWV scheduled Declined per pt wife "He does not do wellness visits"

## 2022-07-20 ENCOUNTER — Ambulatory Visit: Payer: Medicare Other | Attending: Cardiology | Admitting: Cardiology

## 2022-07-20 ENCOUNTER — Encounter: Payer: Self-pay | Admitting: Cardiology

## 2022-07-20 VITALS — BP 124/78 | HR 64 | Ht 71.0 in | Wt 226.0 lb

## 2022-07-20 DIAGNOSIS — R0609 Other forms of dyspnea: Secondary | ICD-10-CM

## 2022-07-20 DIAGNOSIS — I2510 Atherosclerotic heart disease of native coronary artery without angina pectoris: Secondary | ICD-10-CM

## 2022-07-20 DIAGNOSIS — E782 Mixed hyperlipidemia: Secondary | ICD-10-CM

## 2022-07-20 DIAGNOSIS — I350 Nonrheumatic aortic (valve) stenosis: Secondary | ICD-10-CM | POA: Diagnosis not present

## 2022-07-20 HISTORY — DX: Atherosclerotic heart disease of native coronary artery without angina pectoris: I25.10

## 2022-07-20 MED ORDER — ATORVASTATIN CALCIUM 80 MG PO TABS: 80.0000 mg | ORAL_TABLET | Freq: Every day | ORAL | 3 refills | Status: DC

## 2022-07-20 NOTE — Addendum Note (Signed)
Addended by: Jacobo Forest D on: 07/20/2022 03:57 PM   Modules accepted: Orders

## 2022-07-20 NOTE — Progress Notes (Signed)
Cardiology Office Note:    Date:  07/20/2022   ID:  Rigley Niess, DOB 01-10-1951, MRN [id number]  PCP:  Marge Duncans, PA-C  Cardiologist:  Jenne Campus, MD    Referring MD: Marge Duncans, PA-C   Chief Complaint  Patient presents with   Follow-up  Doing fine with  History of Present Illness:    Angel Orr is a 71 y.o. male past medical history significant for coronary artery disease.  He did have coronary CT angio done which showed multiple lesions in multiple arteries however none of this appears to be hemodynamically significant.  He did have fractional flow reserve done.  On top of that he is asymptomatic.  Denies have any chest pain tightness squeezing pressure burning chest.  Additional problems include essential hypertension, dyslipidemia.  Actually one of the first reason why we seen he was the fact that he was short of breath after COVID-19 infection.  Echocardiogram was negative coronary CT angio nonobstructive disease since that time he states that he is doing better.  He still works and he have to walk a lot and have no difficulty doing it.  Past Medical History:  Diagnosis Date   Chronic GERD    Depression    Hyperlipemia    Major depressive disorder, single episode, moderate (HCC)     Past Surgical History:  Procedure Laterality Date   ANKLE SURGERY      Current Medications: Current Meds  Medication Sig   aspirin 81 MG EC tablet Take by mouth.   atorvastatin (LIPITOR) 40 MG tablet Take 1 tablet (40 mg total) by mouth daily.   meloxicam (MOBIC) 7.5 MG tablet Take 1 tablet (7.5 mg total) by mouth daily.   Multiple Vitamins-Minerals (MULTI COMPLETE PO) Take by mouth.   Omega-3 Fatty Acids (FISH OIL) 1000 MG CAPS Take by mouth.   omeprazole (PRILOSEC) 40 MG capsule Take 1 capsule (40 mg total) by mouth daily.   sertraline (ZOLOFT) 100 MG tablet Take 1 tablet (100 mg total) by mouth daily.     Allergies:   Aleve [naproxen], Codeine, and Morphine   Social  History   Socioeconomic History   Marital status: Married    Spouse name: Not on file   Number of children: Not on file   Years of education: Not on file   Highest education level: Not on file  Occupational History   Occupation: truck driver  Tobacco Use   Smoking status: Former    Types: Cigarettes    Quit date: 2003    Years since quitting: 20.7   Smokeless tobacco: Never  Vaping Use   Vaping Use: Never used  Substance and Sexual Activity   Alcohol use: Never   Drug use: Never   Sexual activity: Not on file  Other Topics Concern   Not on file  Social History Narrative   Not on file   Social Determinants of Health   Financial Resource Strain: Not on file  Food Insecurity: Not on file  Transportation Needs: Not on file  Physical Activity: Not on file  Stress: Not on file  Social Connections: Not on file     Family History: The patient's family history includes CAD in his father, mother, and sister; CVA in his mother; Cancer in his sister. ROS:   Please see the history of present illness.    All 14 point review of systems negative except as described per history of present illness  EKGs/Labs/Other Studies Reviewed:      Recent  Labs: 06/14/2022: ALT 24; BUN 17; Creatinine, Ser 1.09; Hemoglobin 12.5; Platelets 231; Potassium 4.8; Sodium 141; TSH 2.750  Recent Lipid Panel    Component Value Date/Time   CHOL 194 06/14/2022 1133   TRIG 194 (H) 06/14/2022 1133   HDL 58 06/14/2022 1133   CHOLHDL 3.3 06/14/2022 1133   LDLCALC 103 (H) 06/14/2022 1133    Physical Exam:    VS:  BP 124/78 (BP Location: Left Arm, Patient Position: Sitting)   Pulse 64   Ht '5\' 11"'$  (1.803 m)   Wt 226 lb (102.5 kg)   SpO2 96%   BMI 31.52 kg/m     Wt Readings from Last 3 Encounters:  07/20/22 226 lb (102.5 kg)  06/14/22 224 lb (101.6 kg)  04/23/22 226 lb (102.5 kg)     GEN:  Well nourished, well developed in no acute distress HEENT: Normal NECK: No JVD; No carotid  bruits LYMPHATICS: No lymphadenopathy CARDIAC: RRR, no murmurs, no rubs, no gallops RESPIRATORY:  Clear to auscultation without rales, wheezing or rhonchi  ABDOMEN: Soft, non-tender, non-distended MUSCULOSKELETAL:  No edema; No deformity  SKIN: Warm and dry LOWER EXTREMITIES: no swelling NEUROLOGIC:  Alert and oriented x 3 PSYCHIATRIC:  Normal affect   ASSESSMENT:    1. Nonrheumatic aortic valve stenosis   2. Coronary artery disease involving native coronary artery of native heart without angina pectoris   3. Mixed hyperlipidemia   4. Dyspnea on exertion    PLAN:    In order of problems listed above:  Nonrheumatic aortic valve stenosis only mild.  I cannot auscultate any heart murmur on the physical exam. Coronary artery disease: Hemodynamically insignificant.  He is on antiplatelet therapy in form of aspirin 81 mg daily which I will continue.  The key in this situation is risk factors modifications, his blood pressure is well controlled, his cholesterol is not however I we did talk about need to exercise and be active which she already does, I did briefly review with him basic of Mediterranean diet. Mixed dyslipidemia I did review K PN which only his LDL 103 HDL 58.  I will double the dose of Lipitor to 80 mg daily if you have some intolerance to this medication then will reduce Lipitor back to 40 mg and Zetia.   Medication Adjustments/Labs and Tests Ordered: Current medicines are reviewed at length with the patient today.  Concerns regarding medicines are outlined above.  No orders of the defined types were placed in this encounter.  Medication changes: No orders of the defined types were placed in this encounter.   Signed, Park Liter, MD, [facility] 07/20/2022 3:42 PM    [place]

## 2022-07-20 NOTE — Patient Instructions (Signed)
Medication Instructions:  Your physician has recommended you make the following change in your medication:   INCREASE: Lipitor to '80mg'$  daily.  You may double you current dose and your next refill will reflect your new dose.   Lab Work: Your physician recommends that you return for lab work in: 6 weeks  You need to have labs done when you are fasting.  You can come Monday through Friday 8:30 am to 12:00 pm and 1:15 to 4:30. You do not need to make an appointment as the order has already been placed. The labs you are going to have done are ALT, AST and Lipids.    Testing/Procedures: None Ordered   Follow-Up: At [facility], you and your health needs are our priority.  As part of our continuing mission to provide you with exceptional heart care, we have created designated Provider Care Teams.  These Care Teams include your primary Cardiologist (physician) and Advanced Practice Providers (APPs -  Physician Assistants and Nurse Practitioners) who all work together to provide you with the care you need, when you need it.  We recommend signing up for the patient portal called "MyChart".  Sign up information is provided on this After Visit Summary.  MyChart is used to connect with patients for Virtual Visits (Telemedicine).  Patients are able to view lab/test results, encounter notes, upcoming appointments, etc.  Non-urgent messages can be sent to your provider as well.   To learn more about what you can do with MyChart, go to [email or web address].    Your next appointment:   6 month(s)  The format for your next appointment:   In Person  Provider:   Jenne Campus, MD    Other Instructions NA

## 2022-08-21 ENCOUNTER — Encounter: Payer: Self-pay | Admitting: Physician Assistant

## 2022-08-22 ENCOUNTER — Encounter: Payer: Self-pay | Admitting: Physician Assistant

## 2022-08-31 DIAGNOSIS — E782 Mixed hyperlipidemia: Secondary | ICD-10-CM | POA: Diagnosis not present

## 2022-09-01 LAB — LIPID PANEL
Chol/HDL Ratio: 2.6 ratio (ref 0.0–5.0)
Cholesterol, Total: 128 mg/dL (ref 100–199)
HDL: 49 mg/dL (ref >39)
LDL Chol Calc (NIH): 59 mg/dL (ref 0–99)
Triglycerides: 113 mg/dL (ref 0–149)
VLDL Cholesterol Cal: 20 mg/dL (ref 5–40)

## 2022-09-01 LAB — ALT: ALT: 18 IU/L (ref 0–44)

## 2022-09-01 LAB — AST: AST: 18 IU/L (ref 0–40)

## 2022-09-05 ENCOUNTER — Telehealth: Payer: Self-pay

## 2022-09-05 NOTE — Telephone Encounter (Signed)
LVM per DPR regarding normal lab results per Dr. Wendy Poet note. Encouraged to call with any questions or concerns.

## 2022-09-08 IMAGING — CT CT HEART MORP W/ CTA COR W/ SCORE W/ CA W/CM &/OR W/O CM
4 of 7 series · 8 of 20 positions shown, 9 images · IV contrast (APPLIED)
Comparison: None.
COMPARISON: None.

Addendum:
EXAM:
OVER-READ INTERPRETATION  CT CHEST

The following report is an over-read performed by radiologist Dr.
Ling Clanton [REDACTED] on 01/04/2022. This
over-read does not include interpretation of cardiac or coronary
anatomy or pathology. The coronary CTA interpretation by the
cardiologist is attached.
CLINICAL DATA: CP
Cardiac/Coronary  CTA
TECHNIQUE: The patient was scanned on a [employer] Force scanner.

[Series 6: ts diast · axial · 0.39mm/px · z∈[+104,+141]mm · 2 of 280 slices shown, 3 images]
[im 94/280  vessel]
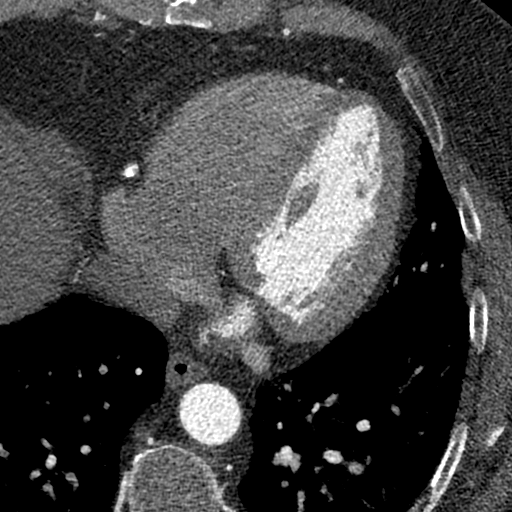
[im 94/280  lung]
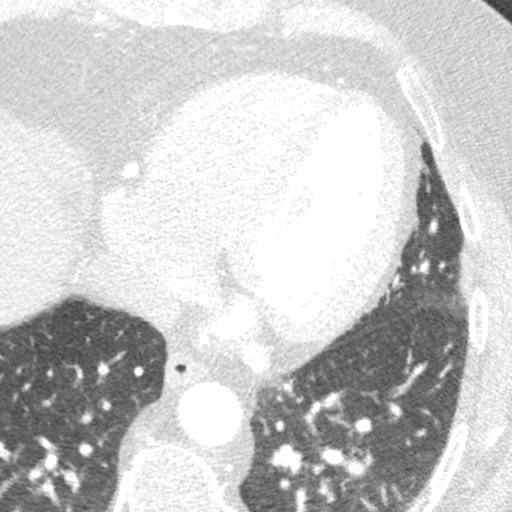
[im 187/280  vessel]
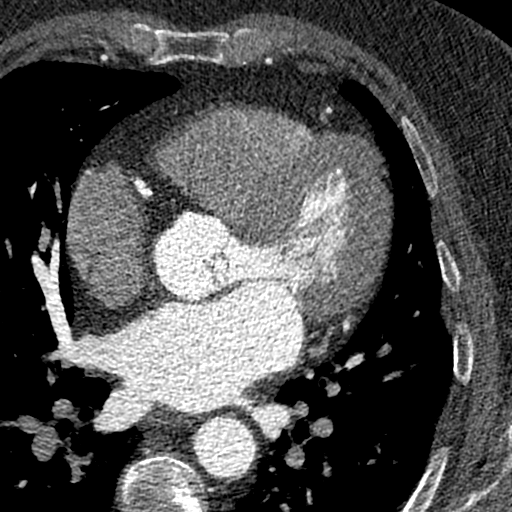

[Series 7: ts syst · axial · 0.39mm/px · z∈[+104,+141]mm · 2 of 280 slices shown]
[im 94/280  vessel]
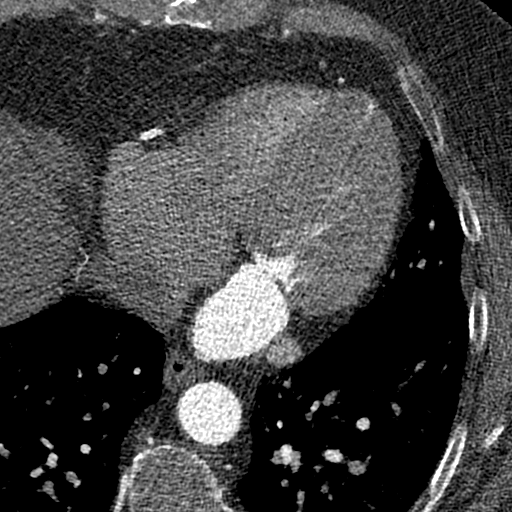
[im 187/280  vessel]
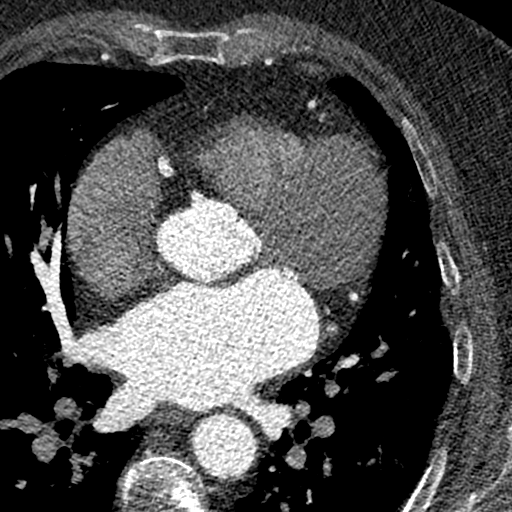

[Series 8: best syst · axial · 0.39mm/px · z∈[+104,+141]mm · 2 of 280 slices shown]
[im 94/280  vessel]
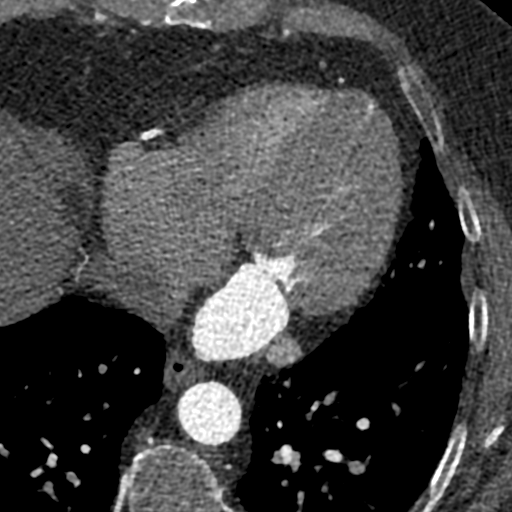
[im 187/280  vessel]
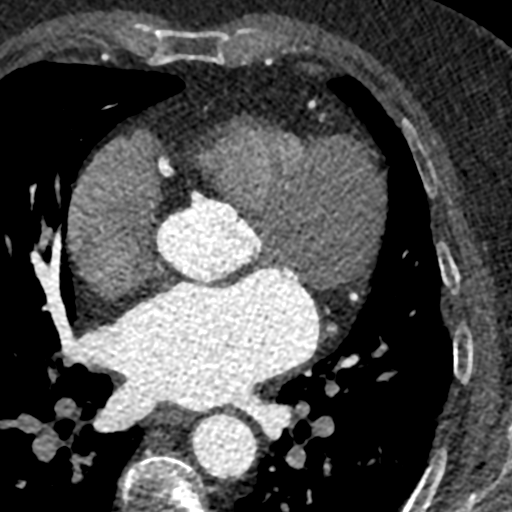

[Series 9: best diast · axial · 0.39mm/px · z∈[+104,+141]mm · 2 of 280 slices shown]
[im 94/280  vessel]
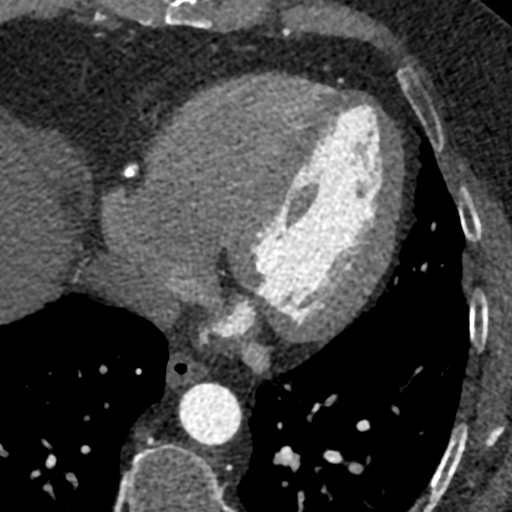
[im 187/280  vessel]
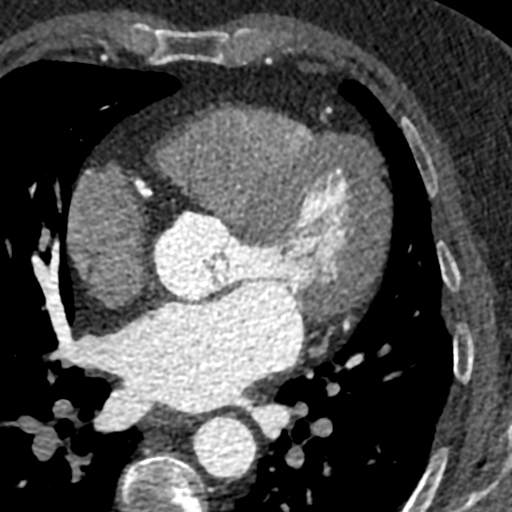

[8 of 20 positions shown; findings below may reference images not displayed]

FINDINGS: Vascular: There is mild atherosclerosis of the thoracic aorta.

Mediastinum/Nodes: Visualized mediastinum and hilar regions
demonstrate no lymphadenopathy or masses.

Lungs/Pleura: Visualized lungs show no evidence of pulmonary edema,
consolidation, pneumothorax, nodule or pleural fluid.

Upper Abdomen: No acute abnormality.

Musculoskeletal: No chest wall mass or suspicious bone lesions
identified.
IMPRESSION: Atherosclerosis of the thoracic aorta.
FINDINGS: A 120 kV prospective scan was triggered in the descending thoracic
aorta at 111 HU's. Axial non-contrast 3 mm slices were carried out
through the heart. The data set was analyzed on a dedicated work
station and scored using the Agatson method. Gantry rotation speed
was 250 msecs and collimation was .6 mm. No beta blockade and 0.8 mg
of sl NTG was given. The 3D data set was reconstructed in 5%
intervals of the 67-82 % of the R-R cycle. Diastolic phases were
analyzed on a dedicated work station using MPR, MIP and VRT modes.
The patient received 80 cc of contrast.

Aorta: Normal size. Mild calcifications noted in ascending and
descending aorta. No dissection.

Aortic Valve:  Trileaflet.  Mild calcifications.

Coronary Arteries:  Normal coronary origin.  Right dominance.

RCA is a large dominant tortuous artery that gives rise to PDA and
PLA. There are multiple calcified plaques in its proximal portion
with mild stenosis of 25-49%. In the mid portion of this artery
there is mixed plaque with moderate stenosis of 50-70%.

Left main is a large artery that gives rise to LAD and LCX arteries.
LM has calcified plaque in its distal portion with minimal stenosis
of 25-49%.

LAD is a large vessel that has multiple calcified plaques in its
proximal and mid portion with minimal stenosis of 25-49%. This
artery gives rise to large D1.

LCX is a non-dominant artery that gives rise to one large OM1
branch. There are calcified plaques in proximal portion of JOAN with
minimal stenosis of 25-49%.

Other findings:

Normal pulmonary vein drainage into the left atrium.

Normal left atrial appendage without a thrombus.

Normal size of the pulmonary artery.

Small PFO noted with flow from left to right.
IMPRESSION: 1. Coronary calcium score of 3473, LM 118, LAD 475, JOAN 260, RCA 545.
This was 89 percentile for age and sex matched control.

2. Normal coronary origin with right dominance.

3. Multiple plaques noted in LM, RCA, JOAN and LAD arteries.

4. CAD-RADS 3. Moderate stenosis. Consider symptom-guided
anti-ischemic pharmacotherapy as well as risk factor modification
per guideline directed care. Additional analysis with CT FFR will be
submitted.

5.  Small PFO noted.

*** End of Addendum ***
EXAM:
OVER-READ INTERPRETATION  CT CHEST

The following report is an over-read performed by radiologist Dr.
Ling Clanton [REDACTED] on 01/04/2022. This
over-read does not include interpretation of cardiac or coronary
anatomy or pathology. The coronary CTA interpretation by the
cardiologist is attached.
FINDINGS: Vascular: There is mild atherosclerosis of the thoracic aorta.

Mediastinum/Nodes: Visualized mediastinum and hilar regions
demonstrate no lymphadenopathy or masses.

Lungs/Pleura: Visualized lungs show no evidence of pulmonary edema,
consolidation, pneumothorax, nodule or pleural fluid.

Upper Abdomen: No acute abnormality.

Musculoskeletal: No chest wall mass or suspicious bone lesions
identified.
IMPRESSION: Atherosclerosis of the thoracic aorta.

## 2022-12-17 ENCOUNTER — Ambulatory Visit (INDEPENDENT_AMBULATORY_CARE_PROVIDER_SITE_OTHER): Payer: Medicare Other

## 2022-12-17 VITALS — Wt 225.0 lb

## 2022-12-17 DIAGNOSIS — Z0000 Encounter for general adult medical examination without abnormal findings: Secondary | ICD-10-CM | POA: Diagnosis not present

## 2022-12-17 NOTE — Progress Notes (Signed)
Subjective:   Angel Orr is a 72 y.o. male who presents for Medicare Annual/Subsequent preventive examination. I connected with  Angel Orr on 12/17/22 by a audio enabled telemedicine application and verified that I am speaking with the correct person using two identifiers.  Patient Location: Home  Provider Location: Office/Clinic  I discussed the limitations of evaluation and management by telemedicine. The patient expressed understanding and agreed to proceed.  His next scheduled visit with Angel Orr is January 18, 2023.  Cardiac Risk Factors include: advanced age (>22mn, >>26women);male gender;obesity (BMI >30kg/m2)     Objective:    Today's Vitals   12/17/22 1410  Weight: 225 lb (102.1 kg)   Body mass index is 31.38 kg/m.  Current Medications (verified) Outpatient Encounter Medications as of 12/17/2022  Medication Sig   aspirin 81 MG EC tablet Take by mouth.   atorvastatin (LIPITOR) 80 MG tablet Take 1 tablet (80 mg total) by mouth daily.   meloxicam (MOBIC) 7.5 MG tablet Take 1 tablet (7.5 mg total) by mouth daily.   Multiple Vitamins-Minerals (MULTI COMPLETE PO) Take by mouth.   omeprazole (PRILOSEC) 40 MG capsule Take 1 capsule (40 mg total) by mouth daily.   sertraline (ZOLOFT) 100 MG tablet Take 1 tablet (100 mg total) by mouth daily.   Omega-3 Fatty Acids (FISH OIL) 1000 MG CAPS Take by mouth. (Patient not taking: Reported on 12/17/2022)   No facility-administered encounter medications on file as of 12/17/2022.    Allergies (verified) Aleve [naproxen], Codeine, and Morphine   History: Past Medical History:  Diagnosis Date   Chronic GERD    Depression    Hyperlipemia    Major depressive disorder, single episode, moderate (HCC)    Past Surgical History:  Procedure Laterality Date   ANKLE SURGERY     Family History  Problem Relation Age of Onset   CVA Mother    CAD Mother    Cancer Mother    CAD Father    Brain cancer Sister    CAD Sister     Social History   Socioeconomic History   Marital status: Married    Spouse name: Orr Angel  Number of children: 0   Years of education: 12   Highest education level: 12th grade  Occupational History   Occupation: TAdministrator(Retired)    Comment: for 35 years   Occupation: DRetail buyer   Comment: works 3 days a week  Tobacco Use   Smoking status: Former    Types: Cigarettes    Quit date: 2003    Years since quitting: 21.1   Smokeless tobacco: Never  Vaping Use   Vaping Use: Never used  Substance and Sexual Activity   Alcohol use: Never   Drug use: Never   Sexual activity: Not on file  Other Topics Concern   Not on file  Social History Narrative   Not on file   Social Determinants of Health   Financial Resource Strain: Low Risk  (12/17/2022)   Overall Financial Resource Strain (CARDIA)    Difficulty of Paying Living Expenses: Not hard at all  Food Insecurity: No Food Insecurity (12/17/2022)   Hunger Vital Sign    Worried About Running Out of Food in the Last Year: Never true    RBethunein the Last Year: Never true  Transportation Needs: No Transportation Needs (12/17/2022)   PRAPARE - THydrologist(Medical): No    Lack of Transportation (Non-Medical): No  Physical Activity: Inactive (12/17/2022)   Exercise Vital Sign    Days of Exercise per Week: 0 days    Minutes of Exercise per Session: 0 min  Stress: No Stress Concern Present (12/17/2022)   [place]    Feeling of Stress : Not at all  Social Connections: Moderately Isolated (12/17/2022)   Social Connection and Isolation Panel [NHANES]    Frequency of Communication with Friends and Family: Three times a week    Frequency of Social Gatherings with Friends and Family: Twice a week    Attends Religious Services: More than 4 times per year    Active Member of [employer] or Organizations: No    Attends Theatre manager Meetings: Never    Marital Status: Widowed    Tobacco Counseling Counseling given: Not Answered   Clinical Intake:  Pre-visit preparation completed: Yes Pain : No/denies pain   BMI - recorded: 31.53 Nutritional Status: BMI > 30  Obese Nutritional Risks: None Diabetes: No How often do you need to have someone help you when you read instructions, pamphlets, or other written materials from your doctor or pharmacy?: 1 - Never Interpreter Needed?: No    Activities of Daily Living    12/17/2022    2:06 PM  In your present state of health, do you have any difficulty performing the following activities:  Hearing? 1  Comment HOH  Vision? 0  Difficulty concentrating or making decisions? 1  Walking or climbing stairs? 0  Dressing or bathing? 0  Doing errands, shopping? 0  Preparing Food and eating ? N  Using the Toilet? N  In the past six months, have you accidently leaked urine? N  Do you have problems with loss of bowel control? N  Managing your Medications? N  Managing your Finances? N  Housekeeping or managing your Housekeeping? N    Patient Care Team: Marge Duncans, Hershal Coria as PCP - General (Physician Assistant)     Assessment:   This is a routine wellness examination for Angel Orr.  Hearing/Vision screen No results found.  Dietary issues and exercise activities discussed: Current Exercise Habits: The patient does not participate in regular exercise at present (patient is active around the home and when he works), Exercise limited by: None identified   Depression Screen    12/17/2022    2:20 PM 06/14/2022   10:59 AM 10/25/2021    8:09 AM 05/24/2021    8:36 AM 01/09/2021    8:07 AM 10/05/2020   10:26 AM 07/04/2020    8:19 AM  PHQ 2/9 Scores  PHQ - 2 Score 0 0 0 0 0 0 0  PHQ- 9 Score  0 0 3 0      Fall Risk    12/17/2022    2:25 PM 06/14/2022   10:59 AM 05/24/2021    8:36 AM 07/04/2020    8:19 AM 05/14/2017   11:33 AM  [place] in the past year?  0 0 0 0 No  Comment     Emmi Telephone Survey: data to providers prior to load  Number falls in past yr: 0 0 0 0   Injury with Fall? 0 0 0 0   Risk for fall due to : No Fall Risks No Fall Risks     Follow up Falls evaluation completed;Education provided Falls evaluation completed;Falls prevention discussed       FALL RISK PREVENTION PERTAINING TO THE HOME:  Any stairs in  or around the home? No  If so, are there any without handrails? No  Home free of loose throw rugs in walkways, pet beds, electrical cords, etc? Yes  Adequate lighting in your home to reduce risk of falls? Yes   ASSISTIVE DEVICES UTILIZED TO PREVENT FALLS:  Life alert? No  Use of a cane, walker or w/c? No  Grab bars in the bathroom? Yes  Shower chair or bench in shower? Yes  Elevated toilet seat or a handicapped toilet? No   Cognitive Function:        12/17/2022    2:26 PM  6CIT Screen  What Year? 0 points  What month? 0 points  What time? 0 points  Count back from 20 0 points  Months in reverse 0 points  Repeat phrase 2 points  Total Score 2 points    Immunizations Immunization History  Administered Date(s) Administered   Influenza-Unspecified 08/22/2018   Pneumococcal Conjugate-13 08/08/2015, 09/23/2017   Pneumococcal Polysaccharide-23 08/31/2016    TDAP status: Due, Education has been provided regarding the importance of this vaccine. Advised may receive this vaccine at local pharmacy or Health Dept. Aware to provide a copy of the vaccination record if obtained from local pharmacy or Health Dept. Verbalized acceptance and understanding.  Flu Vaccine status: Declined, Education has been provided regarding the importance of this vaccine but patient still declined. Advised may receive this vaccine at local pharmacy or Health Dept. Aware to provide a copy of the vaccination record if obtained from local pharmacy or Health Dept. Verbalized acceptance and understanding.  Pneumococcal vaccine status: Up  to date  Covid-19 vaccine status: Declined, Education has been provided regarding the importance of this vaccine but patient still declined. Advised may receive this vaccine at local pharmacy or Health Dept.or vaccine clinic. Aware to provide a copy of the vaccination record if obtained from local pharmacy or Health Dept. Verbalized acceptance and understanding.  Qualifies for Shingles Vaccine? Yes   Zostavax completed No   Shingrix Completed?: No.    Education has been provided regarding the importance of this vaccine. Patient has been advised to call insurance company to determine out of pocket expense if they have not yet received this vaccine. Advised may also receive vaccine at local pharmacy or Health Dept. Verbalized acceptance and understanding.  Screening Tests Health Maintenance  Topic Date Due   DTaP/Tdap/Td (1 - Tdap) Never done   Medicare Annual Wellness (AWV)  07/04/2021   INFLUENZA VACCINE  01/20/2023 (Originally 05/22/2022)   COLONOSCOPY (Pts 45-33yr Insurance coverage will need to be confirmed)  01/11/2032   Pneumonia Vaccine 72 Years old  Completed   HPV VACCINES  Aged Out   COVID-19 Vaccine  Discontinued   Hepatitis C Screening  Discontinued   Fecal DNA (Cologuard)  Discontinued   Zoster Vaccines- Shingrix  Discontinued    Health Maintenance  Health Maintenance Due  Topic Date Due   DTaP/Tdap/Td (1 - Tdap) Never done   Medicare Annual Wellness (AWV)  07/04/2021    Colorectal cancer screening: Type of screening: Colonoscopy. Completed 01/10/22.   Lung Cancer Screening: (Low Dose CT Chest recommended if Age 73-80years, 30 pack-year currently smoking OR have quit w/in 15years.) does not qualify.   Lung Cancer Screening Referral: N/A  Additional Screening:  Vision Screening: Recommended annual ophthalmology exams for early detection of glaucoma and other disorders of the eye. Is the patient up to date with their annual eye exam?  Yes   Dental Screening:  Recommended annual  dental exams for proper oral hygiene  Community Resource Referral / Chronic Care Management: CRR required this visit?  No   CCM required this visit?  No      Plan:    1- Patient will check with pharmacy about getting the Tetanus and Shingrix Vaccine 2- Patient declined flu vaccine  I have personally reviewed and noted the following in the patient's chart:   Medical and social history Use of alcohol, tobacco or illicit drugs  Current medications and supplements including opioid prescriptions. Patient is not currently taking opioid prescriptions. Functional ability and status Nutritional status Physical activity Advanced directives List of other physicians Hospitalizations, surgeries, and ER visits in previous 12 months Vitals Screenings to include cognitive, depression, and falls Referrals and appointments  In addition, I have reviewed and discussed with patient certain preventive protocols, quality metrics, and best practice recommendations. A written personalized care plan for preventive services as well as general preventive health recommendations were provided to patient.     Erie Noe, LPN   [id number]

## 2022-12-17 NOTE — Patient Instructions (Signed)
Angel Orr , Thank you for taking time to come for your Medicare Wellness Visit. I appreciate your ongoing commitment to your health goals. Please review the following plan we discussed and let me know if I can assist you in the future.   Screening recommendations/referrals: Colonoscopy: No longer required Recommended yearly ophthalmology/optometry visit for glaucoma screening and checkup Recommended yearly dental visit for hygiene and checkup  Vaccinations: Influenza vaccine: declined Pneumococcal vaccine: Complete Tdap vaccine: Due - you can get this at the pharmacy Shingles vaccine: Due - you can get this at the pharmacy    Advanced directives: Please bring a copy for your medical record  Next appointment: 01/18/23 with [facility] 65 Years and Older, Male Preventive care refers to lifestyle choices and visits with your health care provider that can promote health and wellness. What does preventive care include? A yearly physical exam. This is also called an annual well check. Dental exams once or twice a year. Routine eye exams. Ask your health care provider how often you should have your eyes checked. Personal lifestyle choices, including: Daily care of your teeth and gums. Regular physical activity. Eating a healthy diet. Avoiding tobacco and drug use. Limiting alcohol use. Practicing safe sex. Taking low doses of aspirin every day. Taking vitamin and mineral supplements as recommended by your health care provider. What happens during an annual well check? The services and screenings done by your health care provider during your annual well check will depend on your age, overall health, lifestyle risk factors, and family history of disease. Counseling  Your health care provider may ask you questions about your: Alcohol use. Tobacco use. Drug use. Emotional well-being. Home and relationship well-being. Sexual activity. Eating habits. History of  falls. Memory and ability to understand (cognition). Work and work Statistician. Screening  You may have the following tests or measurements: Height, weight, and BMI. Blood pressure. Lipid and cholesterol levels. These may be checked every 5 years, or more frequently if you are over 35 years old. Skin check. Lung cancer screening. You may have this screening every year starting at age 66 if you have a 30-pack-year history of smoking and currently smoke or have quit within the past 15 years. Fecal occult blood test (FOBT) of the stool. You may have this test every year starting at age 16. Flexible sigmoidoscopy or colonoscopy. You may have a sigmoidoscopy every 5 years or a colonoscopy every 10 years starting at age 73. Prostate cancer screening. Recommendations will vary depending on your family history and other risks. Hepatitis C blood test. Hepatitis B blood test. Sexually transmitted disease (STD) testing. Diabetes screening. This is done by checking your blood sugar (glucose) after you have not eaten for a while (fasting). You may have this done every 1-3 years. Abdominal aortic aneurysm (AAA) screening. You may need this if you are a current or former smoker. Osteoporosis. You may be screened starting at age 78 if you are at high risk. Talk with your health care provider about your test results, treatment options, and if necessary, the need for more tests. Vaccines  Your health care provider may recommend certain vaccines, such as: Influenza vaccine. This is recommended every year. Tetanus, diphtheria, and acellular pertussis (Tdap, Td) vaccine. You may need a Td booster every 10 years. Zoster vaccine. You may need this after age 60. Pneumococcal 13-valent conjugate (PCV13) vaccine. One dose is recommended after age 61. Pneumococcal polysaccharide (PPSV23) vaccine. One dose is recommended after age 85. Talk  to your health care provider about which screenings and vaccines you need and  how often you need them. This information is not intended to replace advice given to you by your health care provider. Make sure you discuss any questions you have with your health care provider. Document Released: 11/04/2015 Document Revised: 06/27/2016 Document Reviewed: 08/09/2015 Elsevier Interactive Patient Education  2017 [employer] Prevention in the Home Falls can cause injuries. They can happen to people of all ages. There are many things you can do to make your home safe and to help prevent falls. What can I do on the outside of my home? Regularly fix the edges of walkways and driveways and fix any cracks. Remove anything that might make you trip as you walk through a door, such as a raised step or threshold. Trim any bushes or trees on the path to your home. Use bright outdoor lighting. Clear any walking paths of anything that might make someone trip, such as rocks or tools. Regularly check to see if handrails are loose or broken. Make sure that both sides of any steps have handrails. Any raised decks and porches should have guardrails on the edges. Have any leaves, snow, or ice cleared regularly. Use sand or salt on walking paths during winter. Clean up any spills in your garage right away. This includes oil or grease spills. What can I do in the bathroom? Use night lights. Install grab bars by the toilet and in the tub and shower. Do not use towel bars as grab bars. Use non-skid mats or decals in the tub or shower. If you need to sit down in the shower, use a plastic, non-slip stool. Keep the floor dry. Clean up any water that spills on the floor as soon as it happens. Remove soap buildup in the tub or shower regularly. Attach bath mats securely with double-sided non-slip rug tape. Do not have throw rugs and other things on the floor that can make you trip. What can I do in the bedroom? Use night lights. Make sure that you have a light by your bed that is easy to  reach. Do not use any sheets or blankets that are too big for your bed. They should not hang down onto the floor. Have a firm chair that has side arms. You can use this for support while you get dressed. Do not have throw rugs and other things on the floor that can make you trip. What can I do in the kitchen? Clean up any spills right away. Avoid walking on wet floors. Keep items that you use a lot in easy-to-reach places. If you need to reach something above you, use a strong step stool that has a grab bar. Keep electrical cords out of the way. Do not use floor polish or wax that makes floors slippery. If you must use wax, use non-skid floor wax. Do not have throw rugs and other things on the floor that can make you trip. What can I do with my stairs? Do not leave any items on the stairs. Make sure that there are handrails on both sides of the stairs and use them. Fix handrails that are broken or loose. Make sure that handrails are as long as the stairways. Check any carpeting to make sure that it is firmly attached to the stairs. Fix any carpet that is loose or worn. Avoid having throw rugs at the top or bottom of the stairs. If you do have throw rugs,  attach them to the floor with carpet tape. Make sure that you have a light switch at the top of the stairs and the bottom of the stairs. If you do not have them, ask someone to add them for you. What else can I do to help prevent falls? Wear shoes that: Do not have high heels. Have rubber bottoms. Are comfortable and fit you well. Are closed at the toe. Do not wear sandals. If you use a stepladder: Make sure that it is fully opened. Do not climb a closed stepladder. Make sure that both sides of the stepladder are locked into place. Ask someone to hold it for you, if possible. Clearly mark and make sure that you can see: Any grab bars or handrails. First and last steps. Where the edge of each step is. Use tools that help you move  around (mobility aids) if they are needed. These include: Canes. Walkers. Scooters. Crutches. Turn on the lights when you go into a dark area. Replace any light bulbs as soon as they burn out. Set up your furniture so you have a clear path. Avoid moving your furniture around. If any of your floors are uneven, fix them. If there are any pets around you, be aware of where they are. Review your medicines with your doctor. Some medicines can make you feel dizzy. This can increase your chance of falling. Ask your doctor what other things that you can do to help prevent falls. This information is not intended to replace advice given to you by your health care provider. Make sure you discuss any questions you have with your health care provider. Document Released: 08/04/2009 Document Revised: 03/15/2016 Document Reviewed: 11/12/2014 Elsevier Interactive Patient Education  2017 [employer].

## 2022-12-24 ENCOUNTER — Ambulatory Visit: Payer: Medicare Other | Admitting: Physician Assistant

## 2023-01-18 ENCOUNTER — Encounter: Payer: Self-pay | Admitting: Physician Assistant

## 2023-01-18 ENCOUNTER — Ambulatory Visit (INDEPENDENT_AMBULATORY_CARE_PROVIDER_SITE_OTHER): Payer: Medicare Other | Admitting: Physician Assistant

## 2023-01-18 VITALS — BP 102/60 | HR 77 | Temp 97.0°F | Ht 71.0 in | Wt 229.8 lb

## 2023-01-18 DIAGNOSIS — E782 Mixed hyperlipidemia: Secondary | ICD-10-CM | POA: Diagnosis not present

## 2023-01-18 DIAGNOSIS — R0789 Other chest pain: Secondary | ICD-10-CM

## 2023-01-18 DIAGNOSIS — R0609 Other forms of dyspnea: Secondary | ICD-10-CM

## 2023-01-18 DIAGNOSIS — Z125 Encounter for screening for malignant neoplasm of prostate: Secondary | ICD-10-CM

## 2023-01-18 DIAGNOSIS — R3912 Poor urinary stream: Secondary | ICD-10-CM

## 2023-01-18 DIAGNOSIS — M79621 Pain in right upper arm: Secondary | ICD-10-CM

## 2023-01-18 DIAGNOSIS — K219 Gastro-esophageal reflux disease without esophagitis: Secondary | ICD-10-CM | POA: Diagnosis not present

## 2023-01-18 DIAGNOSIS — F419 Anxiety disorder, unspecified: Secondary | ICD-10-CM | POA: Diagnosis not present

## 2023-01-18 MED ORDER — OMEPRAZOLE 40 MG PO CPDR: 40.0000 mg | DELAYED_RELEASE_CAPSULE | Freq: Every day | ORAL | 1 refills | Status: DC

## 2023-01-18 MED ORDER — SERTRALINE HCL 100 MG PO TABS: 100.0000 mg | ORAL_TABLET | Freq: Every day | ORAL | 1 refills | Status: DC

## 2023-01-18 MED ORDER — ATORVASTATIN CALCIUM 80 MG PO TABS: 80.0000 mg | ORAL_TABLET | Freq: Every day | ORAL | 1 refills | Status: DC

## 2023-01-18 MED ORDER — MELOXICAM 7.5 MG PO TABS: 7.5000 mg | ORAL_TABLET | Freq: Every day | ORAL | 1 refills | Status: DC

## 2023-01-18 NOTE — Progress Notes (Signed)
Subjective:  Patient ID: Angel Orr, male    DOB: 07/30/1951  Age: 72 y.o. MRN: [id number]  Chief Complaint  Patient presents with   Hyperlipidemia    Hyperlipidemia    Mixed hyperlipidemia  Pt presents with hyperlipidemia.  Compliance with treatment has been goodThe patient is compliant with medications, maintains a low cholesterol diet , follows up as directed , and maintains an exercise regimen . The patient denies experiencing any hypercholesterolemia related symptoms.  Currently on fish oil and lipitor 80mg  qd  Pt with history of GERD - stable on prilosec 40mg  qd Pt states he did see GI and had a normal colonoscopy in March 2023-   Pt with history of depression - stable on zoloft 100mg  qd - voices no other problems or concerns  Pt was referred to cardiology a year ago for symptoms of exertional dyspnea - at that time he had an echo and coronary CT angio showed nonobstructive disease.  He states that symptoms may have started after COVID HOWEVER pt states about 2-3 months ago he began noticing even more fatigue and dyspnea walking to the mailbox. He says that he has started getting up earlier and moving more and says because of that his symptoms have improved.  ALSO pt does mention intermittent chest pains (he would like to say it could be his reflux but symptoms do not necessarily correlate with that) He last saw cardiology 9/23     Current Outpatient Medications on File Prior to Visit  Medication Sig Dispense Refill   aspirin 81 MG EC tablet Take by mouth.     Multiple Vitamins-Minerals (MULTI COMPLETE PO) Take by mouth.     Omega-3 Fatty Acids (FISH OIL) 1000 MG CAPS Take by mouth.     No current facility-administered medications on file prior to visit.   Past Medical History:  Diagnosis Date   Chronic GERD    Depression    Hyperlipemia    Major depressive disorder, single episode, moderate (HCC)    Past Surgical History:  Procedure Laterality Date   ANKLE  SURGERY      Family History  Problem Relation Age of Onset   CVA Mother    CAD Mother    Cancer Mother    CAD Father    Brain cancer Sister    CAD Sister    Social History   Socioeconomic History   Marital status: Married    Spouse name: Baker Janus   Number of children: 0   Years of education: 12   Highest education level: 12th grade  Occupational History   Occupation: Administrator (Retired)    Comment: for 35 years   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  Tobacco Use   Smoking status: Former    Types: Cigarettes    Quit date: 2003    Years since quitting: 21.2   Smokeless tobacco: Never  Vaping Use   Vaping Use: Never used  Substance and Sexual Activity   Alcohol use: Never   Drug use: Never   Sexual activity: Not on file  Other Topics Concern   Not on file  Social History Narrative   Not on file   Social Determinants of Health   Financial Resource Strain: Low Risk  (12/17/2022)   Overall Financial Resource Strain (CARDIA)    Difficulty of Paying Living Expenses: Not hard at all  Food Insecurity: No Food Insecurity (12/17/2022)   Hunger Vital Sign    Worried About  Running Out of Food in the Last Year: Never true    [place] in the Last Year: Never true  Transportation Needs: No Transportation Needs (12/17/2022)   PRAPARE - Hydrologist (Medical): No    Lack of Transportation (Non-Medical): No  Physical Activity: Inactive (12/17/2022)   Exercise Vital Sign    Days of Exercise per Week: 0 days    Minutes of Exercise per Session: 0 min  Stress: No Stress Concern Present (12/17/2022)   [place]    Feeling of Stress : Not at all  Social Connections: Moderately Isolated (12/17/2022)   Social Connection and Isolation Panel [NHANES]    Frequency of Communication with Friends and Family: Three times a week    Frequency of Social Gatherings with  Friends and Family: Twice a week    Attends Religious Services: More than 4 times per year    Active Member of [employer] or Organizations: No    Attends Archivist Meetings: Never    Marital Status: Widowed   CONSTITUTIONAL: Negative for chills, fatigue, fever, unintentional weight gain and unintentional weight loss.  E/N/T: Negative for ear pain, nasal congestion and sore throat.  CARDIOVASCULAR: see HPI  RESPIRATORY: Negative for recent cough and dyspnea. - has had exertional dyspnea GASTROINTESTINAL: Negative for abdominal pain, acid reflux symptoms, constipation, diarrhea, nausea and vomiting.  MSK: Negative for arthralgias and myalgias.  INTEGUMENTARY: Negative for rash.  NEUROLOGICAL: Negative for dizziness and headaches.  PSYCHIATRIC: Negative for sleep disturbance and to question depression screen.  Negative for depression, negative for anhedonia.       Objective:  PHYSICAL EXAM:   VS: BP 102/60 (BP Location: Left Arm, Patient Position: Sitting, Cuff Size: Large)   Pulse 77   Temp (!) 97 F (36.1 C) (Temporal)   Ht 5\' 11"  (1.803 m)   Wt 229 lb 12.8 oz (104.2 kg)   SpO2 97%   BMI 32.05 kg/m   GEN: Well nourished, well developed, in no acute distress  HEENT: normal external ears and nose - normal external auditory canals and TMS -  - Lips, Teeth and Gums - normal  Oropharynx - normal mucosa, palate, and posterior pharynx Cardiac: RRR; no murmurs, rubs, or gallops,no edema - no significant varicosities Respiratory:  normal respiratory rate and pattern with no distress - normal breath sounds with no rales, rhonchi, wheezes or rubs MS: no deformity or atrophy  Skin: warm and dry, no rash  Psych: euthymic mood, appropriate affect and demeanor  EKG - normal Lab Results  Component Value Date   WBC 6.1 06/14/2022   HGB 12.5 (L) 06/14/2022   HCT 37.1 (L) 06/14/2022   PLT 231 06/14/2022   GLUCOSE 93 06/14/2022   CHOL 128 08/31/2022   TRIG 113 08/31/2022   HDL 49  08/31/2022   LDLCALC 59 08/31/2022   ALT 18 08/31/2022   AST 18 08/31/2022   NA 141 06/14/2022   K 4.8 06/14/2022   CL 105 06/14/2022   CREATININE 1.09 06/14/2022   BUN 17 06/14/2022   CO2 21 06/14/2022   TSH 2.750 06/14/2022      Assessment & Plan:   Problem List Items Addressed This Visit       Digestive   Gastroesophageal reflux disease without esophagitis Continue current meds     Other   Mixed hyperlipidemia - Primary CAD   Relevant Orders   CBC with Differential/Platelet  Comprehensive metabolic panel   Lipid panel Continue meds and watch diet   Depression Continue zoloft as directed  Exertional dyspnea Chest xray ordered Referral to cardiology  Atypical chest pain CAD Refer back to cardiology  .  Meds ordered this encounter  Medications   atorvastatin (LIPITOR) 80 MG tablet    Sig: Take 1 tablet (80 mg total) by mouth daily.    Dispense:  90 tablet    Refill:  1    Order Specific Question:   Supervising Provider    Answer:   Shelton Silvas   meloxicam (MOBIC) 7.5 MG tablet    Sig: Take 1 tablet (7.5 mg total) by mouth daily.    Dispense:  90 tablet    Refill:  1    Order Specific Question:   Supervising Provider    Answer:   Shelton Silvas   omeprazole (PRILOSEC) 40 MG capsule    Sig: Take 1 capsule (40 mg total) by mouth daily.    Dispense:  90 capsule    Refill:  1    Order Specific Question:   Supervising Provider    Answer:   Shelton Silvas   sertraline (ZOLOFT) 100 MG tablet    Sig: Take 1 tablet (100 mg total) by mouth daily.    Dispense:  90 tablet    Refill:  1    Order Specific Question:   Supervising Provider    AnswerShelton Silvas    Orders Placed This Encounter  Procedures   DG Chest 2 View   CBC with Differential/Platelet   Comprehensive metabolic panel   Lipid panel   PSA   TSH   Ambulatory referral to Cardiology   EKG 12-Lead     Follow-up: Return in about 6 months (around  07/21/2023) for chronic fasting follow up.  An After Visit Summary was printed and given to the patient.  Yetta Flock [facility] [phone]

## 2023-01-19 LAB — COMPREHENSIVE METABOLIC PANEL
ALT: 21 IU/L (ref 0–44)
AST: 23 IU/L (ref 0–40)
Albumin/Globulin Ratio: 1.7 (ref 1.2–2.2)
Albumin: 4.3 g/dL (ref 3.8–4.8)
Alkaline Phosphatase: 87 IU/L (ref 44–121)
BUN/Creatinine Ratio: 16 (ref 10–24)
BUN: 17 mg/dL (ref 8–27)
Bilirubin Total: 0.4 mg/dL (ref 0.0–1.2)
CO2: 19 mmol/L — ABNORMAL LOW (ref 20–29)
Calcium: 9.1 mg/dL (ref 8.6–10.2)
Chloride: 106 mmol/L (ref 96–106)
Creatinine, Ser: 1.08 mg/dL (ref 0.76–1.27)
Globulin, Total: 2.6 g/dL (ref 1.5–4.5)
Glucose: 98 mg/dL (ref 70–99)
Potassium: 4.5 mmol/L (ref 3.5–5.2)
Sodium: 143 mmol/L (ref 134–144)
Total Protein: 6.9 g/dL (ref 6.0–8.5)
eGFR: 73 mL/min/{1.73_m2} (ref >59)

## 2023-01-19 LAB — CBC WITH DIFFERENTIAL/PLATELET
Basophils Absolute: 0 10*3/uL (ref 0.0–0.2)
Basos: 1 %
EOS (ABSOLUTE): 0.2 10*3/uL (ref 0.0–0.4)
Eos: 4 %
Hematocrit: 37.4 % — ABNORMAL LOW (ref 37.5–51.0)
Hemoglobin: 12.4 g/dL — ABNORMAL LOW (ref 13.0–17.7)
Immature Grans (Abs): 0 10*3/uL (ref 0.0–0.1)
Immature Granulocytes: 0 %
Lymphocytes Absolute: 1.7 10*3/uL (ref 0.7–3.1)
Lymphs: 30 %
MCH: 28.8 pg (ref 26.6–33.0)
MCHC: 33.2 g/dL (ref 31.5–35.7)
MCV: 87 fL (ref 79–97)
Monocytes Absolute: 0.5 10*3/uL (ref 0.1–0.9)
Monocytes: 8 %
Neutrophils Absolute: 3.3 10*3/uL (ref 1.4–7.0)
Neutrophils: 57 %
Platelets: 216 10*3/uL (ref 150–450)
RBC: 4.31 x10E6/uL (ref 4.14–5.80)
RDW: 14 % (ref 11.6–15.4)
WBC: 5.7 10*3/uL (ref 3.4–10.8)

## 2023-01-19 LAB — TSH: TSH: 3.03 u[IU]/mL (ref 0.450–4.500)

## 2023-01-19 LAB — LIPID PANEL
Chol/HDL Ratio: 3 ratio (ref 0.0–5.0)
Cholesterol, Total: 176 mg/dL (ref 100–199)
HDL: 59 mg/dL (ref >39)
LDL Chol Calc (NIH): 85 mg/dL (ref 0–99)
Triglycerides: 191 mg/dL — ABNORMAL HIGH (ref 0–149)
VLDL Cholesterol Cal: 32 mg/dL (ref 5–40)

## 2023-01-19 LAB — PSA: Prostate Specific Ag, Serum: <0.1 ng/mL (ref 0.0–4.0)

## 2023-01-19 LAB — CARDIOVASCULAR RISK ASSESSMENT

## 2023-02-01 DIAGNOSIS — R079 Chest pain, unspecified: Secondary | ICD-10-CM | POA: Diagnosis not present

## 2023-02-01 DIAGNOSIS — R0789 Other chest pain: Secondary | ICD-10-CM | POA: Diagnosis not present

## 2023-02-11 ENCOUNTER — Other Ambulatory Visit: Payer: Self-pay

## 2023-02-11 DIAGNOSIS — R0609 Other forms of dyspnea: Secondary | ICD-10-CM

## 2023-02-11 DIAGNOSIS — R0789 Other chest pain: Secondary | ICD-10-CM

## 2023-02-15 ENCOUNTER — Ambulatory Visit: Payer: Medicare Other | Attending: Cardiology | Admitting: Cardiology

## 2023-02-15 ENCOUNTER — Encounter: Payer: Self-pay | Admitting: Cardiology

## 2023-02-15 VITALS — BP 120/72 | HR 67 | Ht 71.0 in | Wt 229.8 lb

## 2023-02-15 DIAGNOSIS — I350 Nonrheumatic aortic (valve) stenosis: Secondary | ICD-10-CM

## 2023-02-15 DIAGNOSIS — E782 Mixed hyperlipidemia: Secondary | ICD-10-CM

## 2023-02-15 DIAGNOSIS — R0609 Other forms of dyspnea: Secondary | ICD-10-CM | POA: Diagnosis not present

## 2023-02-15 DIAGNOSIS — I2510 Atherosclerotic heart disease of native coronary artery without angina pectoris: Secondary | ICD-10-CM

## 2023-02-15 NOTE — Patient Instructions (Signed)

## 2023-02-15 NOTE — Progress Notes (Signed)
Cardiology Office Note:    Date:  02/15/2023   ID:  Angel Orr, DOB 04/05/1951, MRN [id number]  PCP:  Marianne Sofia, PA-C  Cardiologist:  Gypsy Balsam, MD    Referring MD: Marianne Sofia, PA-C   Chief Complaint  Patient presents with   Follow-up  Right  History of Present Illness:    Angel Orr is a 72 y.o. male past medical history significant for coronary artery disease he did have coronary CT angio which showed multiple lesions but hemodynamically insignificant, he did have fractional flow reserve done which showed no significant stenosis.  On top of that she did not have any symptoms she was referred back to [place] because apparently started complaining of chest pain as well as more fatigue and shortness of breath.  He tells me today that everything subsided he does not have any symptoms anymore the pain that he described was chest sensation that happened when he states at the same time he was able to walk climb stairs and no symptomatology all of this is subsided and he is doing well right now.  Described to have some fatigue tiredness but nothing extraordinary  Past Medical History:  Diagnosis Date   Chronic GERD    Depression    Hyperlipemia    Major depressive disorder, single episode, moderate (HCC)     Past Surgical History:  Procedure Laterality Date   ANKLE SURGERY      Current Medications: Current Meds  Medication Sig   aspirin 81 MG EC tablet Take by mouth.   atorvastatin (LIPITOR) 80 MG tablet Take 1 tablet (80 mg total) by mouth daily.   meloxicam (MOBIC) 7.5 MG tablet Take 1 tablet (7.5 mg total) by mouth daily.   Multiple Vitamins-Minerals (MULTI COMPLETE PO) Take 1 tablet by mouth daily.   Omega-3 Fatty Acids (FISH OIL) 1000 MG CAPS Take 1 capsule by mouth daily.   omeprazole (PRILOSEC) 40 MG capsule Take 1 capsule (40 mg total) by mouth daily.   sertraline (ZOLOFT) 100 MG tablet Take 1 tablet (100 mg total) by mouth daily.     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  Tobacco Use   Smoking status: Former    Types: Cigarettes    Quit date: 2003    Years since quitting: 21.3   Smokeless tobacco: Never  Vaping Use   Vaping Use: Never used  Substance and Sexual Activity   Alcohol use: Never   Drug use: Never   Sexual activity: Not on file  Other Topics Concern   Not on file  Social History Narrative   Not on file   Social Determinants of Health   Financial Resource Strain: Low Risk  (12/17/2022)   Overall Financial Resource Strain (CARDIA)    Difficulty of Paying Living Expenses: Not hard at all  Food Insecurity: No Food Insecurity (12/17/2022)   Hunger Vital Sign    Worried About Running Out of Food in the Last Year: Never true    Ran Out of Food in the Last Year: Never true  Transportation Needs: No Transportation Needs (12/17/2022)  PRAPARE - Administrator, Civil Service (Medical): No    Lack of Transportation (Non-Medical): No  Physical Activity: Inactive (12/17/2022)   Exercise Vital Sign    Days of Exercise per Week: 0 days    Minutes of Exercise per Session: 0 min  Stress: No Stress Concern Present (12/17/2022)   [employer] of Occupational Health - Occupational Stress Questionnaire    Feeling of Stress : Not at all  Social Connections: Moderately Isolated (12/17/2022)   Social Connection and Isolation Panel [NHANES]    Frequency of Communication with Friends and Family: Three times a week    Frequency of Social Gatherings with Friends and Family: Twice a week    Attends Religious Services: More than 4 times per year    Active Member of [employer] or  Organizations: No    Attends Banker Meetings: Never    Marital Status: Widowed     Family History: The patient's family history includes Brain cancer in his sister; CAD in his father, mother, and sister; CVA in his mother; Cancer in his mother. ROS:   Please see the history of present illness.    All 14 point review of systems negative except as described per history of present illness  EKGs/Labs/Other Studies Reviewed:      Recent Labs: 01/18/2023: ALT 21; BUN 17; Creatinine, Ser 1.08; Hemoglobin 12.4; Platelets 216; Potassium 4.5; Sodium 143; TSH 3.030  Recent Lipid Panel    Component Value Date/Time   CHOL 176 01/18/2023 1048   TRIG 191 (H) 01/18/2023 1048   HDL 59 01/18/2023 1048   CHOLHDL 3.0 01/18/2023 1048   LDLCALC 85 01/18/2023 1048    Physical Exam:    VS:  BP 120/72 (BP Location: Left Arm, Patient Position: Sitting)   Pulse 67   Ht 5\' 11"  (1.803 m)   Wt 229 lb 12.8 oz (104.2 kg)   SpO2 98%   BMI 32.05 kg/m     Wt Readings from Last 3 Encounters:  02/15/23 229 lb 12.8 oz (104.2 kg)  01/18/23 229 lb 12.8 oz (104.2 kg)  12/17/22 225 lb (102.1 kg)     GEN:  Well nourished, well developed in no acute distress HEENT: Normal NECK: No JVD; No carotid bruits LYMPHATICS: No lymphadenopathy CARDIAC: RRR, soft systolic murmur grade 1/6 to 2/6 best heard right upper portion of the sternum, no rubs, no gallops RESPIRATORY:  Clear to auscultation without rales, wheezing or rhonchi  ABDOMEN: Soft, non-tender, non-distended MUSCULOSKELETAL:  No edema; No deformity  SKIN: Warm and dry LOWER EXTREMITIES: no swelling NEUROLOGIC:  Alert and oriented x 3 PSYCHIATRIC:  Normal affect   ASSESSMENT:    1. Coronary artery disease involving native coronary artery of native heart without angina pectoris   2. Nonrheumatic aortic valve stenosis   3. Dyspnea on exertion   4. Mixed hyperlipidemia    PLAN:    In order of problems listed above:  Coronary  disease: Stable from that point review.  He did have some symptoms which are very atypical but now completely asymptomatic, will continue monitoring. Nonrheumatic aortic valve stenosis will repeat echocardiogram ensure there is no worsening of the valve.  I be very surprised if there is such a quick progression of this problem however. Dyspnea on exertion again echocardiogram will be done to assess left ventricle ejection fraction. Dyslipidemia I did review his K PN which show me his LDL of 85 HDL 59 will continue present management   Medication Adjustments/Labs and  Tests Ordered: Current medicines are reviewed at length with the patient today.  Concerns regarding medicines are outlined above.  No orders of the defined types were placed in this encounter.  Medication changes: No orders of the defined types were placed in this encounter.   Signed, Georgeanna Lea, MD, [facility] 02/15/2023 11:08 AM    [facility]

## 2023-03-19 ENCOUNTER — Ambulatory Visit: Payer: Medicare Other | Attending: Cardiology

## 2023-03-19 DIAGNOSIS — R0609 Other forms of dyspnea: Secondary | ICD-10-CM | POA: Diagnosis not present

## 2023-03-19 LAB — ECHOCARDIOGRAM COMPLETE
P 1/2 time: 570 msec
S' Lateral: 2.6 cm

## 2023-03-21 ENCOUNTER — Telehealth: Payer: Self-pay | Admitting: Cardiology

## 2023-03-21 ENCOUNTER — Telehealth: Payer: Self-pay

## 2023-03-21 NOTE — Telephone Encounter (Signed)
Echo Results reviewed with pt as per Dr. Krasowski's note.  Pt verbalized understanding and had no additional questions. Routed to PCP 

## 2023-03-21 NOTE — Telephone Encounter (Signed)
Pt returning call for echo results  

## 2023-07-26 ENCOUNTER — Ambulatory Visit (INDEPENDENT_AMBULATORY_CARE_PROVIDER_SITE_OTHER): Payer: Medicare Other | Admitting: Physician Assistant

## 2023-07-26 ENCOUNTER — Encounter: Payer: Self-pay | Admitting: Physician Assistant

## 2023-07-26 VITALS — BP 100/62 | HR 74 | Temp 97.3°F | Ht 71.0 in | Wt 222.8 lb

## 2023-07-26 DIAGNOSIS — K219 Gastro-esophageal reflux disease without esophagitis: Secondary | ICD-10-CM

## 2023-07-26 DIAGNOSIS — E782 Mixed hyperlipidemia: Secondary | ICD-10-CM | POA: Diagnosis not present

## 2023-07-26 DIAGNOSIS — M79621 Pain in right upper arm: Secondary | ICD-10-CM

## 2023-07-26 DIAGNOSIS — F419 Anxiety disorder, unspecified: Secondary | ICD-10-CM | POA: Diagnosis not present

## 2023-07-26 DIAGNOSIS — D6489 Other specified anemias: Secondary | ICD-10-CM | POA: Diagnosis not present

## 2023-07-26 MED ORDER — MELOXICAM 7.5 MG PO TABS: 7.5000 mg | ORAL_TABLET | Freq: Every day | ORAL | 1 refills | Status: DC

## 2023-07-26 MED ORDER — OMEPRAZOLE 40 MG PO CPDR: 40.0000 mg | DELAYED_RELEASE_CAPSULE | Freq: Every day | ORAL | 1 refills | Status: DC

## 2023-07-26 MED ORDER — SERTRALINE HCL 100 MG PO TABS
100.0000 mg | ORAL_TABLET | Freq: Every day | ORAL | 1 refills | Status: DC
Start: 2023-07-26 — End: 2024-05-14

## 2023-07-26 MED ORDER — ATORVASTATIN CALCIUM 80 MG PO TABS: 80.0000 mg | ORAL_TABLET | Freq: Every day | ORAL | 1 refills | Status: DC

## 2023-07-26 NOTE — Progress Notes (Signed)
Subjective:  Patient ID: Angel Orr, male    DOB: 08/22/1951  Age: 72 y.o. MRN: [id number]  Chief Complaint  Patient presents with   Medical Management of Chronic Issues    Hyperlipidemia    Mixed hyperlipidemia  Pt presents with hyperlipidemia.  Compliance with treatment has been goodThe patient is compliant with medications, maintains a low cholesterol diet , follows up as directed , and maintains an exercise regimen . The patient denies experiencing any hypercholesterolemia related symptoms.  Currently on  lipitor 80mg  qd  Pt with history of GERD - stable on prilosec 40mg  qd Pt states he did see GI and had a normal colonoscopy in March 2023-   Pt with history of depression - stable on zoloft 100mg  qd - voices no other problems or concerns  Pt does have some skin tags that are bothersome on neck - would like to have removed     Current Outpatient Medications on File Prior to Visit  Medication Sig Dispense Refill   aspirin 81 MG EC tablet Take by mouth.     Multiple Vitamins-Minerals (MULTI COMPLETE PO) Take 1 tablet by mouth daily.     No current facility-administered medications on file prior to visit.   Past Medical History:  Diagnosis Date   Chronic GERD    Depression    Hyperlipemia    Major depressive disorder, single episode, moderate (HCC)    Past Surgical History:  Procedure Laterality Date   ANKLE SURGERY      Family History  Problem Relation Age of Onset   CVA Mother    CAD Mother    Cancer Mother    CAD Father    Brain cancer Sister    CAD Sister    Social History   Socioeconomic History   Marital status: Married    Spouse name: Dondra Spry   Number of children: 0   Years of education: 12   Highest education level: 12th grade  Occupational History   Occupation: Naval architect (Retired)    Comment: for 35 years   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  Tobacco Use   Smoking status: Former    Current packs/day: 0.00     Types: Cigarettes    Quit date: 2003    Years since quitting: 21.7   Smokeless tobacco: Never  Vaping Use   Vaping status: Never Used  Substance and Sexual Activity   Alcohol use: Never   Drug use: Never   Sexual activity: Not on file  Other Topics Concern   Not on file  Social History Narrative   Not on file   Social Determinants of Health   Financial Resource Strain: Low Risk  (12/17/2022)   Overall Financial Resource Strain (CARDIA)    Difficulty of Paying Living Expenses: Not hard at all  Food Insecurity: No Food Insecurity (12/17/2022)   Hunger Vital Sign    Worried About Running Out of Food in the Last Year: Never true    Ran Out of Food in the Last Year: Never true  Transportation Needs: No Transportation Needs (12/17/2022)   PRAPARE - Administrator, Civil Service (Medical): No    Lack of Transportation (Non-Medical): No  Physical Activity: Inactive (12/17/2022)   Exercise Vital Sign    Days of Exercise per Week: 0 days    Minutes of Exercise per Session: 0 min  Stress: No Stress Concern Present (12/17/2022)   [employer] of Occupational Health -  Occupational Stress Questionnaire    Feeling of Stress : Not at all  Social Connections: Moderately Isolated (12/17/2022)   Social Connection and Isolation Panel [NHANES]    Frequency of Communication with Friends and Family: Three times a week    Frequency of Social Gatherings with Friends and Family: Twice a week    Attends Religious Services: More than 4 times per year    Active Member of [employer] or Organizations: No    Attends Banker Meetings: Never    Marital Status: Widowed   CONSTITUTIONAL: Negative for chills, fatigue, fever,  E/N/T: Negative for ear pain, nasal congestion and sore throat.  CARDIOVASCULAR: Negative for chest pain, dizziness, palpitations and pedal edema.  RESPIRATORY: Negative for recent cough and dyspnea.  GASTROINTESTINAL: Negative for abdominal pain, acid reflux  symptoms, constipation, diarrhea, nausea and vomiting.  MSK: Negative for arthralgias and myalgias.  INTEGUMENTARY: see HPI NEUROLOGICAL: Negative for dizziness and headaches.  PSYCHIATRIC: Negative for sleep disturbance and to question depression screen.  Negative for depression, negative for anhedonia.       Objective:  PHYSICAL EXAM:   VS: BP 100/62 (BP Location: Left Arm, Patient Position: Sitting, Cuff Size: Large)   Pulse 74   Temp (!) 97.3 F (36.3 C) (Temporal)   Ht 5\' 11"  (1.803 m)   Wt 222 lb 12.8 oz (101.1 kg)   SpO2 98%   BMI 31.07 kg/m   GEN: Well nourished, well developed, in no acute distress  Cardiac: RRR; no murmurs, rubs, or gallops,no edema - Respiratory:  normal respiratory rate and pattern with no distress - normal breath sounds with no rales, rhonchi, wheezes or rubs  Skin: skin tags noted Neuro:  Alert and Oriented x 3, - CN II-Xii grossly intact Psych: euthymic mood, appropriate affect and demeanor  Lab Results  Component Value Date   WBC 5.7 01/18/2023   HGB 12.4 (L) 01/18/2023   HCT 37.4 (L) 01/18/2023   PLT 216 01/18/2023   GLUCOSE 98 01/18/2023   CHOL 176 01/18/2023   TRIG 191 (H) 01/18/2023   HDL 59 01/18/2023   LDLCALC 85 01/18/2023   ALT 21 01/18/2023   AST 23 01/18/2023   NA 143 01/18/2023   K 4.5 01/18/2023   CL 106 01/18/2023   CREATININE 1.08 01/18/2023   BUN 17 01/18/2023   CO2 19 (L) 01/18/2023   TSH 3.030 01/18/2023      Assessment & Plan:   Problem List Items Addressed This Visit       Digestive   Gastroesophageal reflux disease without esophagitis Continue current meds     Other   Mixed hyperlipidemia - Primary CAD   Relevant Orders   CBC with Differential/Platelet   Comprehensive metabolic panel   Lipid panel Continue meds and watch diet   Depression Continue zoloft as directed  Skin tags Schedule with Dr Sedalia Muta  .  Meds ordered this encounter  Medications   atorvastatin (LIPITOR) 80 MG tablet     Sig: Take 1 tablet (80 mg total) by mouth daily.    Dispense:  90 tablet    Refill:  1    Order Specific Question:   Supervising Provider    Answer:   Corey Harold   meloxicam (MOBIC) 7.5 MG tablet    Sig: Take 1 tablet (7.5 mg total) by mouth daily.    Dispense:  90 tablet    Refill:  1    Order Specific Question:   Supervising Provider  Answer:   Corey Harold   omeprazole (PRILOSEC) 40 MG capsule    Sig: Take 1 capsule (40 mg total) by mouth daily.    Dispense:  90 capsule    Refill:  1    Order Specific Question:   Supervising Provider    Answer:   Corey Harold   sertraline (ZOLOFT) 100 MG tablet    Sig: Take 1 tablet (100 mg total) by mouth daily.    Dispense:  90 tablet    Refill:  1    Order Specific Question:   Supervising Provider    AnswerCorey Harold    Orders Placed This Encounter  Procedures   CBC with Differential/Platelet   Comprehensive metabolic panel   Lipid panel     Follow-up: Return in about 6 months (around 01/24/2024) for chronic fasting follow-up.  An After Visit Summary was printed and given to the patient.  Jettie Pagan [facility] [phone]

## 2023-07-27 LAB — CBC WITH DIFFERENTIAL/PLATELET
Basophils Absolute: 0 10*3/uL (ref 0.0–0.2)
Basos: 1 %
EOS (ABSOLUTE): 0.2 10*3/uL (ref 0.0–0.4)
Eos: 4 %
Hematocrit: 36.8 % — ABNORMAL LOW (ref 37.5–51.0)
Hemoglobin: 11.8 g/dL — ABNORMAL LOW (ref 13.0–17.7)
Immature Grans (Abs): 0 10*3/uL (ref 0.0–0.1)
Immature Granulocytes: 0 %
Lymphocytes Absolute: 1.8 10*3/uL (ref 0.7–3.1)
Lymphs: 30 %
MCH: 29 pg (ref 26.6–33.0)
MCHC: 32.1 g/dL (ref 31.5–35.7)
MCV: 90 fL (ref 79–97)
Monocytes Absolute: 0.5 10*3/uL (ref 0.1–0.9)
Monocytes: 8 %
Neutrophils Absolute: 3.3 10*3/uL (ref 1.4–7.0)
Neutrophils: 57 %
Platelets: 217 10*3/uL (ref 150–450)
RBC: 4.07 x10E6/uL — ABNORMAL LOW (ref 4.14–5.80)
RDW: 13.7 % (ref 11.6–15.4)
WBC: 5.8 10*3/uL (ref 3.4–10.8)

## 2023-07-27 LAB — LIPID PANEL
Chol/HDL Ratio: 3.1 {ratio} (ref 0.0–5.0)
Cholesterol, Total: 162 mg/dL (ref 100–199)
HDL: 53 mg/dL (ref >39)
LDL Chol Calc (NIH): 78 mg/dL (ref 0–99)
Triglycerides: 184 mg/dL — ABNORMAL HIGH (ref 0–149)
VLDL Cholesterol Cal: 31 mg/dL (ref 5–40)

## 2023-07-27 LAB — COMPREHENSIVE METABOLIC PANEL
ALT: 18 [IU]/L (ref 0–44)
AST: 21 [IU]/L (ref 0–40)
Albumin: 4.3 g/dL (ref 3.8–4.8)
Alkaline Phosphatase: 79 [IU]/L (ref 44–121)
BUN/Creatinine Ratio: 18 (ref 10–24)
BUN: 20 mg/dL (ref 8–27)
Bilirubin Total: 0.4 mg/dL (ref 0.0–1.2)
CO2: 22 mmol/L (ref 20–29)
Calcium: 9.1 mg/dL (ref 8.6–10.2)
Chloride: 108 mmol/L — ABNORMAL HIGH (ref 96–106)
Creatinine, Ser: 1.13 mg/dL (ref 0.76–1.27)
Globulin, Total: 2.5 g/dL (ref 1.5–4.5)
Glucose: 91 mg/dL (ref 70–99)
Potassium: 4.3 mmol/L (ref 3.5–5.2)
Sodium: 145 mmol/L — ABNORMAL HIGH (ref 134–144)
Total Protein: 6.8 g/dL (ref 6.0–8.5)
eGFR: 69 mL/min/{1.73_m2} (ref >59)

## 2023-07-29 ENCOUNTER — Other Ambulatory Visit: Payer: Self-pay | Admitting: Physician Assistant

## 2023-07-29 DIAGNOSIS — R899 Unspecified abnormal finding in specimens from other organs, systems and tissues: Secondary | ICD-10-CM

## 2023-07-31 ENCOUNTER — Other Ambulatory Visit: Payer: Self-pay | Admitting: Physician Assistant

## 2023-07-31 DIAGNOSIS — R899 Unspecified abnormal finding in specimens from other organs, systems and tissues: Secondary | ICD-10-CM

## 2023-07-31 LAB — IRON,TIBC AND FERRITIN PANEL
Ferritin: 42 ng/mL (ref 30–400)
Iron Saturation: 20 % (ref 15–55)
Iron: 75 ug/dL (ref 38–169)
Total Iron Binding Capacity: 375 ug/dL (ref 250–450)
UIBC: 300 ug/dL (ref 111–343)

## 2023-07-31 LAB — SPECIMEN STATUS REPORT

## 2023-08-05 ENCOUNTER — Encounter: Payer: Self-pay | Admitting: Family Medicine

## 2023-08-05 ENCOUNTER — Ambulatory Visit (INDEPENDENT_AMBULATORY_CARE_PROVIDER_SITE_OTHER): Payer: Medicare Other | Admitting: Family Medicine

## 2023-08-05 VITALS — BP 122/72 | HR 78 | Resp 16 | Ht 71.0 in | Wt 224.0 lb

## 2023-08-05 DIAGNOSIS — L918 Other hypertrophic disorders of the skin: Secondary | ICD-10-CM | POA: Diagnosis not present

## 2023-08-05 NOTE — Assessment & Plan Note (Signed)
Successful excision of skin tags.  30 removed.

## 2023-08-05 NOTE — Progress Notes (Signed)
Subjective:  Patient ID: Angel Orr, male    DOB: Apr 25, 1951  Age: 72 y.o. MRN: [id number]  Chief Complaint  Patient presents with   skin tags    HPI   Angel Orr comes in for skin tag removal.  Patient has numerous skin tags in his armpits and his neck.  These get caught on his shirt on a regular basis and are very frustrating and irritated.  They cause skin irritation.     07/26/2023    8:09 AM 01/18/2023   10:18 AM 12/17/2022    2:20 PM 06/14/2022   10:59 AM 10/25/2021    8:09 AM  Depression screen PHQ 2/9  Decreased Interest 0 0 0 0 0  Down, Depressed, Hopeless  0 0 0 0  PHQ - 2 Score 0 0 0 0 0  Altered sleeping 0 0  0 0  Tired, decreased energy 0 0  0 0  Change in appetite 0 0  0 0  Feeling bad or failure about yourself  0 0  0 0  Trouble concentrating 0   0 0  Moving slowly or fidgety/restless 0 0  0 0  Suicidal thoughts 0 0  0 0  PHQ-9 Score 0 0  0 0  Difficult doing work/chores Not difficult at all Not difficult at all  Not difficult at all Not difficult at all        07/26/2023    8:09 AM  Fall Risk   Falls in the past year? 0  Number falls in past yr: 0  Injury with Fall? 0  Risk for fall due to : No Fall Risks  Follow up Falls evaluation completed    Patient Care Team: Marianne Sofia, Cordelia Poche as PCP - General (Physician Assistant)   Review of Systems  Current Outpatient Medications on File Prior to Visit  Medication Sig Dispense Refill   aspirin 81 MG EC tablet Take by mouth.     atorvastatin (LIPITOR) 80 MG tablet Take 1 tablet (80 mg total) by mouth daily. 90 tablet 1   meloxicam (MOBIC) 7.5 MG tablet Take 1 tablet (7.5 mg total) by mouth daily. 90 tablet 1   Multiple Vitamins-Minerals (MULTI COMPLETE PO) Take 1 tablet by mouth daily.     omeprazole (PRILOSEC) 40 MG capsule Take 1 capsule (40 mg total) by mouth daily. 90 capsule 1   sertraline (ZOLOFT) 100 MG tablet Take 1 tablet (100 mg total) by mouth daily. 90 tablet 1   No current  facility-administered medications on file prior to visit.   Past Medical History:  Diagnosis Date   Chronic GERD    Depression    Hyperlipemia    Major depressive disorder, single episode, moderate (HCC)    Past Surgical History:  Procedure Laterality Date   ANKLE SURGERY      Family History  Problem Relation Age of Onset   CVA Mother    CAD Mother    Cancer Mother    CAD Father    Brain cancer Sister    CAD Sister    Social History   Socioeconomic History   Marital status: Married    Spouse name: Dondra Spry   Number of children: 0   Years of education: 12   Highest education level: 12th grade  Occupational History   Occupation: Naval architect (Retired)    Comment: for 35 years   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  Tobacco Use   Smoking status:  Former    Current packs/day: 0.00    Types: Cigarettes    Quit date: 2003    Years since quitting: 21.8   Smokeless tobacco: Never  Vaping Use   Vaping status: Never Used  Substance and Sexual Activity   Alcohol use: Never   Drug use: Never   Sexual activity: Not on file  Other Topics Concern   Not on file  Social History Narrative   Not on file   Social Determinants of Health   Financial Resource Strain: Low Risk  (12/17/2022)   Overall Financial Resource Strain (CARDIA)    Difficulty of Paying Living Expenses: Not hard at all  Food Insecurity: No Food Insecurity (12/17/2022)   Hunger Vital Sign    Worried About Running Out of Food in the Last Year: Never true    Ran Out of Food in the Last Year: Never true  Transportation Needs: No Transportation Needs (12/17/2022)   PRAPARE - Administrator, Civil Service (Medical): No    Lack of Transportation (Non-Medical): No  Physical Activity: Inactive (12/17/2022)   Exercise Vital Sign    Days of Exercise per Week: 0 days    Minutes of Exercise per Session: 0 min  Stress: No Stress Concern Present (12/17/2022)   [employer] of  Occupational Health - Occupational Stress Questionnaire    Feeling of Stress : Not at all  Social Connections: Moderately Isolated (12/17/2022)   Social Connection and Isolation Panel [NHANES]    Frequency of Communication with Friends and Family: Three times a week    Frequency of Social Gatherings with Friends and Family: Twice a week    Attends Religious Services: More than 4 times per year    Active Member of [employer] or Organizations: No    Attends Banker Meetings: Never    Marital Status: Widowed    Objective:  BP 122/72   Pulse 78   Resp 16   Ht 5\' 11"  (1.803 m)   Wt 224 lb (101.6 kg)   BMI 31.24 kg/m      08/05/2023    9:24 AM 07/26/2023    8:07 AM 02/15/2023   10:22 AM  BP/Weight  Systolic BP 122 100 120  Diastolic BP 72 62 72  Wt. (Lbs) 224 222.8 229.8  BMI 31.24 kg/m2 31.07 kg/m2 32.05 kg/m2    Physical Exam Constitutional:      Appearance: Normal appearance. He is obese.  Skin:    Comments: Multiple skin tags on his neck bilaterally and under each axilla.  Skin tag excision: After prepping the skin with alcohol, ethyl chloride was used for anesthesia. 30 skin tags removed were removed. No complications. Band-Aids were used fo any minor bleeding.  Neurological:     Mental Status: He is alert.     Diabetic Foot Exam - Simple   No data filed      Lab Results  Component Value Date   WBC 5.8 07/26/2023   HGB 11.8 (L) 07/26/2023   HCT 36.8 (L) 07/26/2023   PLT 217 07/26/2023   GLUCOSE 91 07/26/2023   CHOL 162 07/26/2023   TRIG 184 (H) 07/26/2023   HDL 53 07/26/2023   LDLCALC 78 07/26/2023   ALT 18 07/26/2023   AST 21 07/26/2023   NA 145 (H) 07/26/2023   K 4.3 07/26/2023   CL 108 (H) 07/26/2023   CREATININE 1.13 07/26/2023   BUN 20 07/26/2023   CO2 22 07/26/2023   TSH 3.030 01/18/2023  Assessment & Plan:    Skin tags, multiple acquired Assessment & Plan: Successful excision of skin tags.  30 removed.      No  orders of the defined types were placed in this encounter.   No orders of the defined types were placed in this encounter.    Follow-up: No follow-ups on file.   I,Carolyn M Morrison,acting as a Neurosurgeon for Blane Ohara, MD.,have documented all relevant documentation on the behalf of Blane Ohara, MD,as directed by  Blane Ohara, MD while in the presence of Blane Ohara, MD.   An After Visit Summary was printed and given to the patient.  Blane Ohara, MD [facility] [phone]

## 2023-08-27 ENCOUNTER — Ambulatory Visit: Payer: Medicare Other

## 2023-08-27 DIAGNOSIS — R899 Unspecified abnormal finding in specimens from other organs, systems and tissues: Secondary | ICD-10-CM | POA: Diagnosis not present

## 2023-08-27 LAB — CBC WITH DIFFERENTIAL/PLATELET
Basophils Absolute: 0 10*3/uL (ref 0.0–0.2)
Basos: 1 %
EOS (ABSOLUTE): 0.3 10*3/uL (ref 0.0–0.4)
Eos: 4 %
Hematocrit: 38.2 % (ref 37.5–51.0)
Hemoglobin: 11.6 g/dL — ABNORMAL LOW (ref 13.0–17.7)
Immature Grans (Abs): 0 10*3/uL (ref 0.0–0.1)
Immature Granulocytes: 0 %
Lymphocytes Absolute: 2 10*3/uL (ref 0.7–3.1)
Lymphs: 31 %
MCH: 27.9 pg (ref 26.6–33.0)
MCHC: 30.4 g/dL — ABNORMAL LOW (ref 31.5–35.7)
MCV: 92 fL (ref 79–97)
Monocytes Absolute: 0.5 10*3/uL (ref 0.1–0.9)
Monocytes: 8 %
Neutrophils Absolute: 3.7 10*3/uL (ref 1.4–7.0)
Neutrophils: 56 %
Platelets: 231 10*3/uL (ref 150–450)
RBC: 4.16 x10E6/uL (ref 4.14–5.80)
RDW: 13.7 % (ref 11.6–15.4)
WBC: 6.6 10*3/uL (ref 3.4–10.8)

## 2023-10-07 ENCOUNTER — Encounter: Payer: Self-pay | Admitting: Cardiology

## 2023-10-10 ENCOUNTER — Ambulatory Visit: Payer: Medicare Other | Attending: Cardiology | Admitting: Cardiology

## 2023-10-10 ENCOUNTER — Encounter: Payer: Self-pay | Admitting: Cardiology

## 2023-10-10 VITALS — BP 122/62 | HR 78 | Ht 71.0 in | Wt 229.6 lb

## 2023-10-10 DIAGNOSIS — R0789 Other chest pain: Secondary | ICD-10-CM | POA: Diagnosis not present

## 2023-10-10 DIAGNOSIS — R0609 Other forms of dyspnea: Secondary | ICD-10-CM | POA: Diagnosis not present

## 2023-10-10 DIAGNOSIS — E782 Mixed hyperlipidemia: Secondary | ICD-10-CM | POA: Diagnosis not present

## 2023-10-10 DIAGNOSIS — I350 Nonrheumatic aortic (valve) stenosis: Secondary | ICD-10-CM | POA: Diagnosis not present

## 2023-10-10 DIAGNOSIS — I2510 Atherosclerotic heart disease of native coronary artery without angina pectoris: Secondary | ICD-10-CM | POA: Insufficient documentation

## 2023-10-10 NOTE — Patient Instructions (Signed)
Medication Instructions:  Your physician recommends that you continue on your current medications as directed. Please refer to the Current Medication list given to you today.  *If you need a refill on your cardiac medications before your next appointment, please call your pharmacy*   Lab Work: None If you have labs (blood work) drawn today and your tests are completely normal, you will receive your results only by: MyChart Message (if you have MyChart) OR A paper copy in the mail If you have any lab test that is abnormal or we need to change your treatment, we will call you to review the results.   Testing/Procedures: None   Follow-Up: At [facility], you and your health needs are our priority.  As part of our continuing mission to provide you with exceptional heart care, we have created designated Provider Care Teams.  These Care Teams include your primary Cardiologist (physician) and Advanced Practice Providers (APPs -  Physician Assistants and Nurse Practitioners) who all work together to provide you with the care you need, when you need it.  We recommend signing up for the patient portal called "MyChart".  Sign up information is provided on this After Visit Summary.  MyChart is used to connect with patients for Virtual Visits (Telemedicine).  Patients are able to view lab/test results, encounter notes, upcoming appointments, etc.  Non-urgent messages can be sent to your provider as well.   To learn more about what you can do with MyChart, go to [email or web address].    Your next appointment:   6 month(s)  Provider:   Robert Krasowski, MD    Other Instructions None  

## 2023-10-10 NOTE — Progress Notes (Signed)
Cardiology Office Note:    Date:  10/10/2023   ID:  Angel Orr, DOB Dec 25, 1950, MRN [id number]  PCP:  Marianne Sofia, PA-C  Cardiologist:  Gypsy Balsam, MD    Referring MD: Marianne Sofia, PA-C   No chief complaint on file.   History of Present Illness:    Angel Orr is a 72 y.o. male past medical history significant for coronary artery disease he did have coronary CT angio which showed multiple borderline lesions hemodynamically insignificant as proven by fractional flow reserve.  Additional problem include mild aortic stenosis, dyslipidemia.  Comes today to months for follow-up.  Overall doing great he started exercising on the regular basis he walks already every day 1 mile and he is planning to increase the distance he said he is feeling better.  Denies have any chest pain tightness squeezing pressure burning chest no dizziness no passing out  Past Medical History:  Diagnosis Date   Aortic stenosis 12/19/2021   Chronic GERD    Coronary artery disease multiple borderline lesion by coronary CT angio by fractional flow reserve negative 07/20/2022   Depression    Hyperlipemia    Major depressive disorder, single episode, moderate (HCC)     Past Surgical History:  Procedure Laterality Date   ANKLE SURGERY      Current Medications: Current Meds  Medication Sig   aspirin 81 MG EC tablet Take by mouth.   atorvastatin (LIPITOR) 80 MG tablet Take 1 tablet (80 mg total) by mouth daily.   meloxicam (MOBIC) 7.5 MG tablet Take 1 tablet (7.5 mg total) by mouth daily.   Multiple Vitamins-Minerals (MULTI COMPLETE PO) Take 1 tablet by mouth daily.   omeprazole (PRILOSEC) 40 MG capsule Take 1 capsule (40 mg total) by mouth daily.   sertraline (ZOLOFT) 100 MG tablet Take 1 tablet (100 mg total) by mouth daily.     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  Tobacco Use   Smoking status: Former    Current packs/day: 0.00    Types: Cigarettes    Quit date: 2003    Years since quitting: 21.9   Smokeless tobacco: Never  Vaping Use   Vaping status: Never Used  Substance and Sexual Activity   Alcohol use: Never   Drug use: Never   Sexual activity: Not on file  Other Topics Concern   Not on file  Social History Narrative   Not on file   Social Drivers of Health   Financial Resource Strain: Low Risk  (12/17/2022)   Overall Financial Resource Strain (CARDIA)    Difficulty of Paying Living Expenses: Not hard at all  Food Insecurity: No Food Insecurity (12/17/2022)   Hunger Vital Sign    Worried About Running Out of Food in the Last Year: Never true    Ran Out of Food in the Last Year: Never true  Transportation Needs: No Transportation Needs (12/17/2022)   PRAPARE - Administrator, Civil Service (Medical): No    Lack of Transportation (Non-Medical): No  Physical Activity: Inactive (12/17/2022)   Exercise Vital Sign  Days of Exercise per Week: 0 days    Minutes of Exercise per Session: 0 min  Stress: No Stress Concern Present (12/17/2022)   [employer] of Occupational Health - Occupational Stress Questionnaire    Feeling of Stress : Not at all  Social Connections: Moderately Isolated (12/17/2022)   Social Connection and Isolation Panel [NHANES]    Frequency of Communication with Friends and Family: Three times a week    Frequency of Social Gatherings with Friends and Family: Twice a week    Attends Religious Services: More than 4 times per year    Active Member of [employer] or Organizations: No    Attends Banker Meetings: Never    Marital Status: Widowed     Family History: The patient's  family history includes Brain cancer in his sister; CAD in his father, mother, and sister; CVA in his mother; Cancer in his mother. ROS:   Please see the history of present illness.    All 14 point review of systems negative except as described per history of present illness  EKGs/Labs/Other Studies Reviewed:         Recent Labs: 01/18/2023: TSH 3.030 07/26/2023: ALT 18; BUN 20; Creatinine, Ser 1.13; Potassium 4.3; Sodium 145 08/27/2023: Hemoglobin 11.6; Platelets 231  Recent Lipid Panel    Component Value Date/Time   CHOL 162 07/26/2023 0838   TRIG 184 (H) 07/26/2023 0838   HDL 53 07/26/2023 0838   CHOLHDL 3.1 07/26/2023 0838   LDLCALC 78 07/26/2023 0838    Physical Exam:    VS:  BP 122/62   Pulse 78   Ht 5\' 11"  (1.803 m)   Wt 229 lb 9.6 oz (104.1 kg)   SpO2 96%   BMI 32.02 kg/m     Wt Readings from Last 3 Encounters:  10/10/23 229 lb 9.6 oz (104.1 kg)  08/05/23 224 lb (101.6 kg)  07/26/23 222 lb 12.8 oz (101.1 kg)     GEN:  Well nourished, well developed in no acute distress HEENT: Normal NECK: No JVD; No carotid bruits LYMPHATICS: No lymphadenopathy CARDIAC: RRR, no murmurs, no rubs, no gallops RESPIRATORY:  Clear to auscultation without rales, wheezing or rhonchi  ABDOMEN: Soft, non-tender, non-distended MUSCULOSKELETAL:  No edema; No deformity  SKIN: Warm and dry LOWER EXTREMITIES: no swelling NEUROLOGIC:  Alert and oriented x 3 PSYCHIATRIC:  Normal affect   ASSESSMENT:    1. Nonrheumatic aortic valve stenosis   2. Coronary artery disease involving native coronary artery of native heart without angina pectoris   3. Dyspnea on exertion   4. Atypical chest pain   5. Mixed hyperlipidemia    PLAN:    In order of problems listed above:  Nonrheumatic aortic valve stenosis only mild continue monitoring asymptomatic. Coronary disease doing great on antiplatelet therapy asymptomatic continue exercising which I encouraged. Dyspnea on exertion: Denies having  any. Dyslipidemia I did review K PN show me LDL 70 HDL 53 continue present management   Medication Adjustments/Labs and Tests Ordered: Current medicines are reviewed at length with the patient today.  Concerns regarding medicines are outlined above.  No orders of the defined types were placed in this encounter.  Medication changes: No orders of the defined types were placed in this encounter.   Signed, Georgeanna Lea, MD, [facility] 10/10/2023 10:01 AM    [facility]

## 2024-01-21 ENCOUNTER — Ambulatory Visit

## 2024-01-28 ENCOUNTER — Encounter: Payer: Self-pay | Admitting: Physician Assistant

## 2024-01-28 ENCOUNTER — Ambulatory Visit (INDEPENDENT_AMBULATORY_CARE_PROVIDER_SITE_OTHER): Payer: Medicare Other | Admitting: Physician Assistant

## 2024-01-28 VITALS — BP 110/60 | HR 99 | Temp 97.4°F | Resp 18 | Ht 71.0 in | Wt 230.4 lb

## 2024-01-28 DIAGNOSIS — R3912 Poor urinary stream: Secondary | ICD-10-CM

## 2024-01-28 DIAGNOSIS — E782 Mixed hyperlipidemia: Secondary | ICD-10-CM | POA: Diagnosis not present

## 2024-01-28 DIAGNOSIS — F419 Anxiety disorder, unspecified: Secondary | ICD-10-CM | POA: Diagnosis not present

## 2024-01-28 DIAGNOSIS — R0609 Other forms of dyspnea: Secondary | ICD-10-CM | POA: Insufficient documentation

## 2024-01-28 DIAGNOSIS — I350 Nonrheumatic aortic (valve) stenosis: Secondary | ICD-10-CM | POA: Diagnosis not present

## 2024-01-28 DIAGNOSIS — K219 Gastro-esophageal reflux disease without esophagitis: Secondary | ICD-10-CM | POA: Diagnosis not present

## 2024-01-28 DIAGNOSIS — R0789 Other chest pain: Secondary | ICD-10-CM | POA: Diagnosis not present

## 2024-01-28 DIAGNOSIS — I25118 Atherosclerotic heart disease of native coronary artery with other forms of angina pectoris: Secondary | ICD-10-CM | POA: Diagnosis not present

## 2024-01-28 MED ORDER — FLUTICASONE PROPIONATE 50 MCG/ACT NA SUSP: 2.0000 | Freq: Every day | NASAL | 6 refills | Status: DC

## 2024-01-28 NOTE — Progress Notes (Signed)
 Subjective:  Patient ID: Angel Orr, male    DOB: 05-06-1951  Age: 73 y.o. MRN: [id number]  Chief Complaint  Patient presents with   Medical Management of Chronic Issues    Hyperlipidemia    Mixed hyperlipidemia  Pt presents with hyperlipidemia.  Compliance with treatment has been goodThe patient is compliant with medications, maintains a low cholesterol diet , follows up as directed , and maintains an exercise regimen . The patient denies experiencing any hypercholesterolemia related symptoms.  Currently on  lipitor 80mg  qd  Pt with history of GERD - stable on prilosec 40mg  qd Pt states he did see GI and had a normal colonoscopy in March 2023-   Pt with history of depression - stable on zoloft 100mg  qd - is not having any breakthrough of those symptoms at this time  Pt complains of runny nose - clear drainage.  At this time he has not taken any medication for symptoms- Says has been going on a few weeks.  Denies cough, fever.  Pt had abdominal CT done 11/22 by Dr Theodoro Grist- it incidentally found an infrarenal abdominal aortic aneurysm measuring at that time 3.4cm.  Was advised to follow up ultrasound in 3 years which will be 11/25 He currently denies any abdominal pain  Pt with history of nonrheumatic aortic valve stenosis and CAD.  He does follow with cardiology regularly and last saw Dr Bing Matter 12/24.  Over the past year he has been having exertional dyspnea with issues walking to mailbox and back and with moderate activity.  He states he has also been having fleeting chest pains with the last episode being last week.  He does states sometimes with activity he is asymptomatic but does feel his symptoms are increasing.  He had an echocardiogram in 5/24 which was stable with preserved left ventricle ejection fraction.  Chest xray 4/24 was normal. Pt did have coronary CT angio but that was in 2023 ---- That test showed multiple borderline areas - RCA with some mild stenosis but some  moderate 50-70 % stenosis however FFR was normal Since symptoms continue to progressively worsen recommend he follow up with cardiology and discussed need to possibly repeat coronary CT angio   Current Outpatient Medications on File Prior to Visit  Medication Sig Dispense Refill   aspirin 81 MG EC tablet Take by mouth.     atorvastatin (LIPITOR) 80 MG tablet Take 1 tablet (80 mg total) by mouth daily. 90 tablet 1   meloxicam (MOBIC) 7.5 MG tablet Take 1 tablet (7.5 mg total) by mouth daily. 90 tablet 1   Multiple Vitamins-Minerals (MULTI COMPLETE PO) Take 1 tablet by mouth daily.     omeprazole (PRILOSEC) 40 MG capsule Take 1 capsule (40 mg total) by mouth daily. 90 capsule 1   sertraline (ZOLOFT) 100 MG tablet Take 1 tablet (100 mg total) by mouth daily. 90 tablet 1   No current facility-administered medications on file prior to visit.   Past Medical History:  Diagnosis Date   Aortic stenosis 12/19/2021   Chronic GERD    Coronary artery disease multiple borderline lesion by coronary CT angio by fractional flow reserve negative 07/20/2022   Depression    Hyperlipemia    Major depressive disorder, single episode, moderate (HCC)    Past Surgical History:  Procedure Laterality Date   ANKLE SURGERY      Family History  Problem Relation Age of Onset   CVA Mother    CAD Mother    Cancer Mother  CAD Father    Brain cancer Sister    CAD Sister    Social History   Socioeconomic History   Marital status: Married    Spouse name: Dondra Spry   Number of children: 0   Years of education: 12   Highest education level: 12th grade  Occupational History   Occupation: Naval architect (Retired)    Comment: for 35 years   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  Tobacco Use   Smoking status: Former    Current packs/day: 0.00    Types: Cigarettes    Quit date: 2003    Years since quitting: 22.2   Smokeless tobacco: Never  Vaping Use   Vaping status: Never Used   Substance and Sexual Activity   Alcohol use: Never   Drug use: Never   Sexual activity: Not on file  Other Topics Concern   Not on file  Social History Narrative   Not on file   Social Drivers of Health   Financial Resource Strain: Low Risk  (12/17/2022)   Overall Financial Resource Strain (CARDIA)    Difficulty of Paying Living Expenses: Not hard at all  Food Insecurity: No Food Insecurity (12/17/2022)   Hunger Vital Sign    Worried About Running Out of Food in the Last Year: Never true    Ran Out of Food in the Last Year: Never true  Transportation Needs: No Transportation Needs (12/17/2022)   PRAPARE - Administrator, Civil Service (Medical): No    Lack of Transportation (Non-Medical): No  Physical Activity: Inactive (12/17/2022)   Exercise Vital Sign    Days of Exercise per Week: 0 days    Minutes of Exercise per Session: 0 min  Stress: No Stress Concern Present (12/17/2022)   [employer] of Occupational Health - Occupational Stress Questionnaire    Feeling of Stress : Not at all  Social Connections: Moderately Isolated (12/17/2022)   Social Connection and Isolation Panel [NHANES]    Frequency of Communication with Friends and Family: Three times a week    Frequency of Social Gatherings with Friends and Family: Twice a week    Attends Religious Services: More than 4 times per year    Active Member of [employer] or Organizations: No    Attends Banker Meetings: Never    Marital Status: Widowed   CONSTITUTIONAL: has had some exertional fatigue E/N/T: Negative for ear pain, nasal congestion and sore throat.  CARDIOVASCULAR: see HPI RESPIRATORY: is having exertional dyspnea GASTROINTESTINAL: Negative for abdominal pain, acid reflux symptoms, constipation, diarrhea, nausea and vomiting.  MSK: Negative for arthralgias and myalgias.  INTEGUMENTARY: Negative for rash.  NEUROLOGICAL: Negative for dizziness and headaches.  PSYCHIATRIC: Negative for sleep  disturbance and to question depression screen.  Negative for depression, negative for anhedonia.       Objective:  PHYSICAL EXAM:   VS: BP 110/60   Pulse 99   Temp (!) 97.4 F (36.3 C) (Temporal)   Resp 18   Ht 5\' 11"  (1.803 m)   Wt 230 lb 6.4 oz (104.5 kg)   SpO2 100%   BMI 32.13 kg/m   GEN: Well nourished, well developed, in no acute distress  HEENT- TMS normal pharynx clear Neck: no JVD or masses - no thyromegaly Cardiac: RRR; soft grade I/VI murmur noted ,no rubs, or gallops,no edema - no significant varicosities Respiratory:  normal respiratory rate and pattern with no distress - normal breath sounds with no  rales, rhonchi, wheezes or rubs GI: normal bowel sounds, no masses or tenderness  Neuro:  Alert and Oriented x 3,  - CN II-Xii grossly intact Psych: euthymic mood, appropriate affect and demeanor  EKG - mild sinus bradycardia otherwise normal Lab Results  Component Value Date   WBC 6.6 08/27/2023   HGB 11.6 (L) 08/27/2023   HCT 38.2 08/27/2023   PLT 231 08/27/2023   GLUCOSE 91 07/26/2023   CHOL 162 07/26/2023   TRIG 184 (H) 07/26/2023   HDL 53 07/26/2023   LDLCALC 78 07/26/2023   ALT 18 07/26/2023   AST 21 07/26/2023   NA 145 (H) 07/26/2023   K 4.3 07/26/2023   CL 108 (H) 07/26/2023   CREATININE 1.13 07/26/2023   BUN 20 07/26/2023   CO2 22 07/26/2023   TSH 3.030 01/18/2023      Assessment & Plan:   Problem List Items Addressed This Visit       Digestive   Gastroesophageal reflux disease without esophagitis Continue current meds     Other   Mixed hyperlipidemia - Primary CAD   Relevant Orders   CBC with Differential/Platelet   Comprehensive metabolic panel   Lipid panel Continue meds and watch diet   Depression Continue zoloft as directed  Allergic rhinitis Rx flonase  Infrarenal abdominal aortic aneurysm Repeat ultrasound 11/25  CAD with exertional dyspnea and chest pain EKG - stable Appt made with cardiology 4/24 for  evaluation and consider to do coronary CT angio  Nonrheumatic aortic valve stenosis Follow with cardiology as directed  .  Meds ordered this encounter  Medications   fluticasone (FLONASE) 50 MCG/ACT nasal spray    Sig: Place 2 sprays into both nostrils daily.    Dispense:  16 g    Refill:  6    Supervising Provider:   Blane Ohara [phone]    Orders Placed This Encounter  Procedures   CBC with Differential/Platelet   Comprehensive metabolic panel with GFR   TSH   Lipid panel   PSA   EKG 12-Lead     Follow-up: Return in about 6 months (around 07/29/2024) for chronic fasting follow-up and MCR wellness with Selena Batten by phone.  An After Visit Summary was printed and given to the patient.  Jettie Pagan [facility] [phone]

## 2024-01-29 LAB — CBC WITH DIFFERENTIAL/PLATELET
Basophils Absolute: 0 10*3/uL (ref 0.0–0.2)
Basos: 0 %
EOS (ABSOLUTE): 0.2 10*3/uL (ref 0.0–0.4)
Eos: 3 %
Hematocrit: 36.6 % — ABNORMAL LOW (ref 37.5–51.0)
Hemoglobin: 11.5 g/dL — ABNORMAL LOW (ref 13.0–17.7)
Immature Grans (Abs): 0 10*3/uL (ref 0.0–0.1)
Immature Granulocytes: 0 %
Lymphocytes Absolute: 1.7 10*3/uL (ref 0.7–3.1)
Lymphs: 25 %
MCH: 27.8 pg (ref 26.6–33.0)
MCHC: 31.4 g/dL — ABNORMAL LOW (ref 31.5–35.7)
MCV: 88 fL (ref 79–97)
Monocytes Absolute: 0.5 10*3/uL (ref 0.1–0.9)
Monocytes: 8 %
Neutrophils Absolute: 4.2 10*3/uL (ref 1.4–7.0)
Neutrophils: 64 %
Platelets: 220 10*3/uL (ref 150–450)
RBC: 4.14 x10E6/uL (ref 4.14–5.80)
RDW: 13.2 % (ref 11.6–15.4)
WBC: 6.6 10*3/uL (ref 3.4–10.8)

## 2024-01-29 LAB — COMPREHENSIVE METABOLIC PANEL WITH GFR
ALT: 22 IU/L (ref 0–44)
AST: 27 IU/L (ref 0–40)
Albumin: 4.1 g/dL (ref 3.8–4.8)
Alkaline Phosphatase: 84 IU/L (ref 44–121)
BUN/Creatinine Ratio: 16 (ref 10–24)
BUN: 19 mg/dL (ref 8–27)
Bilirubin Total: 0.3 mg/dL (ref 0.0–1.2)
CO2: 24 mmol/L (ref 20–29)
Calcium: 9.3 mg/dL (ref 8.6–10.2)
Chloride: 104 mmol/L (ref 96–106)
Creatinine, Ser: 1.19 mg/dL (ref 0.76–1.27)
Globulin, Total: 2.8 g/dL (ref 1.5–4.5)
Glucose: 90 mg/dL (ref 70–99)
Potassium: 5 mmol/L (ref 3.5–5.2)
Sodium: 142 mmol/L (ref 134–144)
Total Protein: 6.9 g/dL (ref 6.0–8.5)
eGFR: 64 mL/min/{1.73_m2} (ref >59)

## 2024-01-29 LAB — LIPID PANEL
Chol/HDL Ratio: 2.5 ratio (ref 0.0–5.0)
Cholesterol, Total: 160 mg/dL (ref 100–199)
HDL: 63 mg/dL (ref >39)
LDL Chol Calc (NIH): 72 mg/dL (ref 0–99)
Triglycerides: 145 mg/dL (ref 0–149)
VLDL Cholesterol Cal: 25 mg/dL (ref 5–40)

## 2024-01-29 LAB — PSA: Prostate Specific Ag, Serum: <0.1 ng/mL (ref 0.0–4.0)

## 2024-01-29 LAB — TSH: TSH: 4.09 u[IU]/mL (ref 0.450–4.500)

## 2024-02-04 ENCOUNTER — Ambulatory Visit

## 2024-02-12 NOTE — Progress Notes (Unsigned)
 Cardiology Office Note:  .   Date:  02/13/2024  ID:  Jamori Biggar, DOB 1951-01-28, MRN [id number] PCP: Cyndi Drain, Kirby Peoples  [facility] Providers Cardiologist:  Ralene Burger, MD    History of Present Illness: .   Maurie Olesen is a 73 y.o. male with a past medical history of CAD, aortic stenosis, GERD, dyslipidemia, PFO.  03/19/2023 echo EF 60 to 65%, mild concentric LVH, grade 1 DD, mild aortic regurgitation, aortic sclerosis present without stenosis 01/04/2022 coronary CTA calcium  score 1401, 89th percentile, FFR revealed low likelihood for hemodynamic significance  He established care with Dr. Gordan Latina on 10/05/2021 at the behest of his PCP for evaluation of shortness of breath.  Apparently this had been progressing since he had COVID infection in 2020.  A coronary CTA was arranged revealing a calcium  score of 1401, FFR revealed low likelihood for hemodynamic significance.  Most recently was evaluated by Dr. Gordan Latina on 10/10/2023, he was doing good from a cardiac perspective, he had begun walking regimen and was walking 1 mile each day, no changes were made to his plan of care and he was advised to follow-up in 6 months.   He presents today for follow-up of shortness of breath.  To him, he does not feel like his shortness of breath is any worse than it typically has been however he was evaluated at his PCP office and they thought he should follow back up with cardiology.  He apparently has been evaluated with pulmonology in the past as he has a former history of tobacco abuse, was told he does not have COPD or any restrictive lung disease.  Regarding shortness of breath, he says has been persistent prior to his COVID infection.  He has been walking about a mile several days of the week, he is able to do this without any anginal complaints, but does feel short of breath at the conclusion of his walk.  He does mention some episodes of atypical chest pain that is sharp in nature, he also  states episodes of chest pain have been going on for 20 years and is nothing new. He denies  palpitations, pnd, orthopnea, n, v, dizziness, syncope, edema, weight gain, or early satiety.    ROS: Review of Systems  Respiratory:  Positive for shortness of breath.   Cardiovascular:  Positive for chest pain.  All other systems reviewed and are negative.    Studies Reviewed: .        Cardiac Studies & Procedures   ______________________________________________________________________________________________     ECHOCARDIOGRAM  ECHOCARDIOGRAM COMPLETE 03/19/2023  Narrative ECHOCARDIOGRAM REPORT    Patient Name:   MUHAMMED Antonini Date of Exam: 03/19/2023 Medical Rec #:  [id number]      Height:       71.0 in Accession #:    [id number]     Weight:       229.8 lb Date of Birth:  01/19/1951      BSA:          2.237 m Patient Age:    73 years       BP:           120/72 mmHg Patient Gender: M              HR:           67 bpm. Exam Location:  [facility]  Procedure: 2D Echo, Cardiac Doppler and Color Doppler  Indications:    Dyspnea on exertion [R06.09 (ICD-10-CM)]  History:  Patient has prior history of Echocardiogram examinations, most recent 10/24/2021. CAD; Signs/Symptoms:Chest Pain and Fatigue.  Sonographer:    Kristen Petri RDCS Referring Phys: [id number] Manfred Seed   Sonographer Comments: Suboptimal apical window. Global longitudinal strain was attempted. IMPRESSIONS   1. Left ventricular ejection fraction, by estimation, is 60 to 65%. The left ventricle has normal function. The left ventricle has no regional wall motion abnormalities. There is mild concentric left ventricular hypertrophy. Left ventricular diastolic parameters are consistent with Grade I diastolic dysfunction (impaired relaxation). 2. Right ventricular systolic function is normal. The right ventricular size is normal. There is normal pulmonary artery systolic pressure. 3. The mitral valve is normal in  structure. No evidence of mitral valve regurgitation. No evidence of mitral stenosis. 4. The aortic valve is tricuspid. Aortic valve regurgitation is mild. Aortic valve sclerosis is present, with no evidence of aortic valve stenosis. 5. Aortic DTA is enlarged 3.4 cm. 6. The inferior vena cava is normal in size with greater than 50% respiratory variability, suggesting right atrial pressure of 3 mmHg.  FINDINGS Left Ventricle: Left ventricular ejection fraction, by estimation, is 60 to 65%. The left ventricle has normal function. The left ventricle has no regional wall motion abnormalities. The left ventricular internal cavity size was normal in size. There is mild concentric left ventricular hypertrophy. Left ventricular diastolic parameters are consistent with Grade I diastolic dysfunction (impaired relaxation). Normal left ventricular filling pressure.  Right Ventricle: The right ventricular size is normal. No increase in right ventricular wall thickness. Right ventricular systolic function is normal. There is normal pulmonary artery systolic pressure. The tricuspid regurgitant velocity is 2.15 m/s, and with an assumed right atrial pressure of 3 mmHg, the estimated right ventricular systolic pressure is 21.5 mmHg.  Left Atrium: Left atrial size was normal in size.  Right Atrium: Right atrial size was normal in size.  Pericardium: There is no evidence of pericardial effusion.  Mitral Valve: The mitral valve is normal in structure. No evidence of mitral valve regurgitation. No evidence of mitral valve stenosis.  Tricuspid Valve: The tricuspid valve is normal in structure. Tricuspid valve regurgitation is mild . No evidence of tricuspid stenosis.  Aortic Valve: The aortic valve is tricuspid. Aortic valve regurgitation is mild. Aortic regurgitation PHT measures 570 msec. Aortic valve sclerosis is present, with no evidence of aortic valve stenosis.  Pulmonic Valve: The pulmonic valve was normal  in structure. Pulmonic valve regurgitation is not visualized. No evidence of pulmonic stenosis.  Aorta: DTA is enlarged 3.4 cm, the aortic arch was not well visualized and the aortic root and ascending aorta are structurally normal, with no evidence of dilitation.  Venous: The inferior vena cava is normal in size with greater than 50% respiratory variability, suggesting right atrial pressure of 3 mmHg.  IAS/Shunts: No atrial level shunt detected by color flow Doppler.   LEFT VENTRICLE PLAX 2D LVIDd:         3.90 cm   Diastology LVIDs:         2.60 cm   LV e' medial:    8.05 cm/s LV PW:         1.20 cm   LV E/e' medial:  7.2 LV IVS:        1.20 cm   LV e' lateral:   8.49 cm/s LVOT diam:     1.90 cm   LV E/e' lateral: 6.8 LV SV:         78 LV SV Index:   35  LVOT Area:     2.84 cm   RIGHT VENTRICLE             IVC RV Basal diam:  2.60 cm     IVC diam: 1.40 cm RV S prime:     10.70 cm/s TAPSE (M-mode): 2.8 cm  LEFT ATRIUM             Index        RIGHT ATRIUM           Index LA diam:        3.10 cm 1.39 cm/m   RA Area:     11.30 cm LA Vol (A2C):   70.2 ml 31.38 ml/m  RA Volume:   25.30 ml  11.31 ml/m LA Vol (A4C):   48.2 ml 21.55 ml/m LA Biplane Vol: 61.7 ml 27.58 ml/m AORTIC VALVE LVOT Vmax:   125.00 cm/s LVOT Vmean:  81.550 cm/s LVOT VTI:    0.276 m AI PHT:      570 msec  AORTA Ao Root diam: 3.40 cm Ao Asc diam:  3.20 cm Ao Desc diam: 3.40 cm  MV E velocity: 57.70 cm/s  TRICUSPID VALVE MV A velocity: 75.20 cm/s  TR Peak grad:   18.5 mmHg MV E/A ratio:  0.77        TR Vmax:        215.00 cm/s  SHUNTS Systemic VTI:  0.28 m Systemic Diam: 1.90 cm  Zoe Hinds MD Electronically signed by Zoe Hinds MD Signature Date/Time: 03/19/2023/12:00:32 PM    Final      CT SCANS  CT CORONARY FRACTIONAL FLOW RESERVE DATA PREP 01/04/2022  Narrative EXAM: FFRCT ANALYSIS  FINDINGS: FFRct analysis was performed on the original cardiac CT angiogram dataset.  Diagrammatic representation of the FFRct analysis is provided in a separate PDF document in PACS. This dictation was created using the PDF document and an interactive 3D model of the results. 3D model is not available in the EMR/PACS. Normal FFR range is >0.80.  1. LM: FFR 0.98 2. LAD: FFR Prox 0.97, mid 0.93, distal 0.85 3. CX: FFR Prox 0.96, mid 0.94, distal 0.9 4. RCA: FFR Prox 0.96, mid 0.87, distal 0.81  Mid RCA lesion: Pre stenosis FFR: 0.87, post stenosis FFR: 0.81. Delta 0.06  IMPRESSION: Study demonstrated low likelihood for hemodynamically significant stenosis within coronary arteries.  Manfred Seed, MD   Electronically Signed By: Ralene Burger M.D. On: 01/04/2022 17:41   CT SCANS  CT CORONARY MORPH W/CTA COR W/SCORE 01/04/2022  Addendum 01/04/2022  5:31 PM ADDENDUM REPORT: 01/04/2022 17:28  CLINICAL DATA:  CP  EXAM: Cardiac/Coronary  CTA  TECHNIQUE: The patient was scanned on a [employer].  FINDINGS: A 120 kV prospective scan was triggered in the descending thoracic aorta at 111 HU's. Axial non-contrast 3 mm slices were carried out through the heart. The data set was analyzed on a dedicated work station and scored using the Agatson method. Gantry rotation speed was 250 msecs and collimation was .6 mm. No beta blockade and 0.8 mg of sl NTG was given. The 3D data set was reconstructed in 5% intervals of the 67-82 % of the R-R cycle. Diastolic phases were analyzed on a dedicated work station using MPR, MIP and VRT modes. The patient received 80 cc of contrast.  Aorta: Normal size. Mild calcifications noted in ascending and descending aorta. No dissection.  Aortic Valve:  Trileaflet.  Mild calcifications.  Coronary Arteries:  Normal coronary origin.  Right dominance.  RCA is a large dominant tortuous artery that gives rise to PDA and PLA. There are multiple calcified plaques in its proximal portion with mild stenosis of 25-49%.  In the mid portion of this artery there is mixed plaque with moderate stenosis of 50-70%.  Left main is a large artery that gives rise to LAD and LCX arteries. LM has calcified plaque in its distal portion with minimal stenosis of 25-49%.  LAD is a large vessel that has multiple calcified plaques in its proximal and mid portion with minimal stenosis of 25-49%. This artery gives rise to large D1.  LCX is a non-dominant artery that gives rise to one large OM1 branch. There are calcified plaques in proximal portion of CX with minimal stenosis of 25-49%.  Other findings:  Normal pulmonary vein drainage into the left atrium.  Normal left atrial appendage without a thrombus.  Normal size of the pulmonary artery.  Small PFO noted with flow from left to right.  IMPRESSION: 1. Coronary calcium  score of 1401, LM 118, LAD 475, CX 260, RCA 545. This was 87 percentile for age and sex matched control.  2. Normal coronary origin with right dominance.  3. Multiple plaques noted in LM, RCA, CX and LAD arteries.  4. CAD-RADS 3. Moderate stenosis. Consider symptom-guided anti-ischemic pharmacotherapy as well as risk factor modification per guideline directed care. Additional analysis with CT FFR will be submitted.  5.  Small PFO noted.   Electronically Signed By: Ralene Burger M.D. On: 01/04/2022 17:28  Narrative EXAM: OVER-READ INTERPRETATION  CT CHEST  The following report is an over-read performed by radiologist Dr. Erica Hau of [facility] on 01/04/2022. This over-read does not include interpretation of cardiac or coronary anatomy or pathology. The coronary CTA interpretation by the cardiologist is attached.  COMPARISON:  None.  FINDINGS: Vascular: There is mild atherosclerosis of the thoracic aorta.  Mediastinum/Nodes: Visualized mediastinum and hilar regions demonstrate no lymphadenopathy or masses.  Lungs/Pleura: Visualized lungs show no  evidence of pulmonary edema, consolidation, pneumothorax, nodule or pleural fluid.  Upper Abdomen: No acute abnormality.  Musculoskeletal: No chest wall mass or suspicious bone lesions identified.  IMPRESSION: Atherosclerosis of the thoracic aorta.  Electronically Signed: By: Erica Hau M.D. On: 01/04/2022 15:34     ______________________________________________________________________________________________      Risk Assessment/Calculations:             Physical Exam:   VS:  BP 120/70   Pulse 75   Ht 5\' 11"  (1.803 m)   Wt 230 lb (104.3 kg)   SpO2 93%   BMI 32.08 kg/m    Wt Readings from Last 3 Encounters:  02/13/24 230 lb (104.3 kg)  01/28/24 230 lb 6.4 oz (104.5 kg)  10/10/23 229 lb 9.6 oz (104.1 kg)    GEN: Well nourished, well developed in no acute distress NECK: No JVD; No carotid bruits CARDIAC: RRR, no murmurs, rubs, gallops RESPIRATORY:  Clear to auscultation without rales, wheezing or rhonchi  ABDOMEN: Soft, non-tender, non-distended EXTREMITIES:  No edema; No deformity   ASSESSMENT AND PLAN: .   CAD-nonobstructive per coronary CTA in 2023.  Continue aspirin 81 mg daily, continue Lipitor 80 mg daily.  Will arrange for nitroglycerin , and discussed when to use it.  He is having some episodes of chest pain, they do not sound to be consistent with angina however he also has some DOE which has been persistent for some time.  Will proceed with an ischemic evaluation.  DOE-this has been persistent for many years, he has been evaluated by pulmonologist and was advised there was nothing from a pulmonary perspective that would be causing symptoms.  It is possible that there is a component of deconditioning however he has been increasing his physical activity recently.  Hypertension-his blood pressure today is well-controlled at 120/70, not currently on any antihypertensive agents.    Informed Consent   Shared Decision Making/Informed Consent The risks [chest  pain, shortness of breath, cardiac arrhythmias, dizziness, blood pressure fluctuations, myocardial infarction, stroke/transient ischemic attack, nausea, vomiting, allergic reaction, radiation exposure, metallic taste sensation and life-threatening complications (estimated to be 1 in 10,000)], benefits (risk stratification, diagnosing coronary artery disease, treatment guidance) and alternatives of a nuclear stress test were discussed in detail with Mr. Hornbaker and he agrees to proceed.     Dispo: Lexiscan, 6 months.   Signed, Terrance Ferretti, NP

## 2024-02-13 ENCOUNTER — Encounter: Payer: Self-pay | Admitting: Cardiology

## 2024-02-13 ENCOUNTER — Ambulatory Visit: Attending: Cardiology | Admitting: Cardiology

## 2024-02-13 VITALS — BP 120/70 | HR 75 | Ht 71.0 in | Wt 230.0 lb

## 2024-02-13 DIAGNOSIS — I350 Nonrheumatic aortic (valve) stenosis: Secondary | ICD-10-CM | POA: Diagnosis not present

## 2024-02-13 DIAGNOSIS — I2510 Atherosclerotic heart disease of native coronary artery without angina pectoris: Secondary | ICD-10-CM

## 2024-02-13 DIAGNOSIS — R0609 Other forms of dyspnea: Secondary | ICD-10-CM | POA: Diagnosis not present

## 2024-02-13 DIAGNOSIS — E782 Mixed hyperlipidemia: Secondary | ICD-10-CM

## 2024-02-13 DIAGNOSIS — R0789 Other chest pain: Secondary | ICD-10-CM

## 2024-02-13 MED ORDER — NITROGLYCERIN 0.4 MG SL SUBL: 0.4000 mg | SUBLINGUAL_TABLET | SUBLINGUAL | 1 refills | Status: AC | PRN

## 2024-02-13 NOTE — Patient Instructions (Signed)
 Medication Instructions:  Your physician has recommended you make the following change in your medication:   START: Nitroglycerin  0.4 mg under the tongue every 5 minutes x 3 doses as needed for chest pain.  *If you need a refill on your cardiac medications before your next appointment, please call your pharmacy*  Lab Work: None If you have labs (blood work) drawn today and your tests are completely normal, you will receive your results only by: MyChart Message (if you have MyChart) OR A paper copy in the mail If you have any lab test that is abnormal or we need to change your treatment, we will call you to review the results.  Testing/Procedures:   [facility] [address] [place], [place] [address] Phone:  [phone]    Please arrive 15 minutes prior to your appointment time for registration and insurance purposes.  The test will take approximately 3 to 4 hours to complete; you may bring reading material.  If someone comes with you to your appointment, they will need to remain in the main lobby due to limited space in the testing area. **If you are pregnant or breastfeeding, please notify the nuclear lab prior to your appointment**  How to prepare for your Myocardial Perfusion Test: Do not eat or drink 3 hours prior to your test, except you may have water. Do not consume products containing caffeine (regular or decaffeinated) 12 hours prior to your test. (ex: coffee, chocolate, sodas, tea). Do bring a list of your current medications with you.  If not listed below, you may take your medications as normal. Do wear comfortable clothes (no dresses or overalls) and walking shoes, tennis shoes preferred (No heels or open toe shoes are allowed). Do NOT wear cologne, perfume, aftershave, or lotions (deodorant is allowed). If these instructions are not followed, your test will have to be rescheduled.  Please report to [address] for your test.  If  you have questions or concerns about your appointment, you can call the [facility] at [phone].  If you cannot keep your appointment, please provide 24 hours notification to the Nuclear Lab, to avoid a possible $50 charge to your account.   Follow-Up: At [facility], you and your health needs are our priority.  As part of our continuing mission to provide you with exceptional heart care, our providers are all part of one team.  This team includes your primary Cardiologist (physician) and Advanced Practice Providers or APPs (Physician Assistants and Nurse Practitioners) who all work together to provide you with the care you need, when you need it.  Your next appointment:   6 month(s)  Provider:   Ralene Burger, MD    We recommend signing up for the patient portal called "MyChart".  Sign up information is provided on this After Visit Summary.  MyChart is used to connect with patients for Virtual Visits (Telemedicine).  Patients are able to view lab/test results, encounter notes, upcoming appointments, etc.  Non-urgent messages can be sent to your provider as well.   To learn more about what you can do with MyChart, go to [email or web address].   Other Instructions None

## 2024-02-15 ENCOUNTER — Other Ambulatory Visit: Payer: Self-pay | Admitting: Physician Assistant

## 2024-02-15 DIAGNOSIS — E782 Mixed hyperlipidemia: Secondary | ICD-10-CM

## 2024-02-27 ENCOUNTER — Telehealth (HOSPITAL_COMMUNITY): Payer: Self-pay | Admitting: *Deleted

## 2024-02-27 NOTE — Telephone Encounter (Signed)
 Patient given detailed instructions per Myocardial Perfusion Study Information Sheet for the test on 03/03/2024 at 11:30. Patient notified to arrive 15 minutes early and that it is imperative to arrive on time for appointment to keep from having the test rescheduled.  If you need to cancel or reschedule your appointment, please call the office within 24 hours of your appointment. . Patient verbalized understanding.Anne Barrios

## 2024-03-03 ENCOUNTER — Ambulatory Visit

## 2024-03-31 ENCOUNTER — Telehealth: Payer: Self-pay | Admitting: *Deleted

## 2024-03-31 NOTE — Telephone Encounter (Signed)
 Pt given instructions for MPI study.

## 2024-04-07 ENCOUNTER — Encounter

## 2024-04-08 ENCOUNTER — Ambulatory Visit: Attending: Cardiology

## 2024-04-08 DIAGNOSIS — I2510 Atherosclerotic heart disease of native coronary artery without angina pectoris: Secondary | ICD-10-CM | POA: Diagnosis not present

## 2024-04-08 DIAGNOSIS — R0609 Other forms of dyspnea: Secondary | ICD-10-CM | POA: Insufficient documentation

## 2024-04-08 DIAGNOSIS — E782 Mixed hyperlipidemia: Secondary | ICD-10-CM | POA: Insufficient documentation

## 2024-04-08 DIAGNOSIS — I350 Nonrheumatic aortic (valve) stenosis: Secondary | ICD-10-CM | POA: Insufficient documentation

## 2024-04-08 DIAGNOSIS — R0789 Other chest pain: Secondary | ICD-10-CM

## 2024-04-08 LAB — MYOCARDIAL PERFUSION IMAGING
LV dias vol: 82 mL (ref 62–150)
LV sys vol: 31 mL
Nuc Stress EF: 62 %
Peak HR: 72 {beats}/min
Rest HR: 63 {beats}/min
Rest Nuclear Isotope Dose: 10.9 mCi
SDS: 3
SRS: 1
SSS: 4
ST Depression (mm): 0 mm
Stress Nuclear Isotope Dose: 32.3 mCi
TID: 1.07

## 2024-04-08 MED ORDER — REGADENOSON 0.4 MG/5ML IV SOLN
0.4000 mg | Freq: Once | INTRAVENOUS | Status: AC
  Administered 2024-04-08: 0.4 mg via INTRAVENOUS

## 2024-04-08 MED ORDER — TECHNETIUM TC 99M TETROFOSMIN IV KIT
10.9000 | PACK | Freq: Once | INTRAVENOUS | Status: AC | PRN
  Administered 2024-04-08: 10.9 via INTRAVENOUS

## 2024-04-08 MED ORDER — TECHNETIUM TC 99M TETROFOSMIN IV KIT
32.3000 | PACK | Freq: Once | INTRAVENOUS | Status: AC | PRN
  Administered 2024-04-08: 32.3 via INTRAVENOUS

## 2024-04-13 ENCOUNTER — Ambulatory Visit: Payer: Self-pay | Admitting: Cardiology

## 2024-05-07 ENCOUNTER — Ambulatory Visit

## 2024-05-08 ENCOUNTER — Ambulatory Visit: Admitting: Physician Assistant

## 2024-05-14 ENCOUNTER — Other Ambulatory Visit: Payer: Self-pay | Admitting: Physician Assistant

## 2024-05-14 DIAGNOSIS — M79621 Pain in right upper arm: Secondary | ICD-10-CM

## 2024-05-14 DIAGNOSIS — K219 Gastro-esophageal reflux disease without esophagitis: Secondary | ICD-10-CM

## 2024-05-14 DIAGNOSIS — F419 Anxiety disorder, unspecified: Secondary | ICD-10-CM

## 2024-05-22 ENCOUNTER — Encounter: Payer: Self-pay | Admitting: Physician Assistant

## 2024-05-22 ENCOUNTER — Ambulatory Visit: Admitting: Physician Assistant

## 2024-05-22 VITALS — BP 108/60 | HR 100 | Temp 98.3°F | Ht 71.0 in | Wt 234.2 lb

## 2024-05-22 DIAGNOSIS — F418 Other specified anxiety disorders: Secondary | ICD-10-CM

## 2024-05-22 DIAGNOSIS — I25118 Atherosclerotic heart disease of native coronary artery with other forms of angina pectoris: Secondary | ICD-10-CM

## 2024-05-22 DIAGNOSIS — K219 Gastro-esophageal reflux disease without esophagitis: Secondary | ICD-10-CM

## 2024-05-22 DIAGNOSIS — R0789 Other chest pain: Secondary | ICD-10-CM

## 2024-05-22 MED ORDER — SERTRALINE HCL 100 MG PO TABS: 100.0000 mg | ORAL_TABLET | Freq: Every day | ORAL | 1 refills | Status: DC

## 2024-05-22 MED ORDER — OMEPRAZOLE 40 MG PO CPDR
40.0000 mg | DELAYED_RELEASE_CAPSULE | Freq: Every day | ORAL | 1 refills | Status: DC
Start: 2024-05-22 — End: 2024-08-13

## 2024-05-22 NOTE — Progress Notes (Signed)
 Subjective:  Patient ID: Angel Orr, male    DOB: 1951-06-25  Age: 73 y.o. MRN: [id number]  Chief Complaint  Patient presents with   Medical Management of Chronic Issues    HPI Pt in for follow up of chest pain and dyspnea.  He did have cardiology evaluation and had a stress cardiolite test which was normal.  He states overall he is feeling well but does still have some chest wall tenderness that he says tightens up 'like a muscle' and then relieves .  He states that happens intermittently perhaps once monthly and has been going on for more than 15 years.  He is a past smoker and requests chest xray today - last one was in 4/24 which was normal  Pt with GERD - requests refill of omeprazole  which controls his symptoms well  Pt with depression with anxiety - he requests refill of zoloft  100mg  and says that medication is working well for him.  His wife died about 3 months ago and he is still grieving but overall coping well and has family and friends support     05/22/2024   10:37 AM 01/28/2024    8:05 AM 07/26/2023    8:09 AM 01/18/2023   10:18 AM 12/17/2022    2:20 PM  Depression screen PHQ 2/9  Decreased Interest 0 0 0 0 0  Down, Depressed, Hopeless 1 0  0 0  PHQ - 2 Score 1 0 0 0 0  Altered sleeping 0 0 0 0   Tired, decreased energy 1 0 0 0   Change in appetite 0 0 0 0   Feeling bad or failure about yourself  0 0 0 0   Trouble concentrating 0 0 0    Moving slowly or fidgety/restless 0 0 0 0   Suicidal thoughts 0 0 0 0   PHQ-9 Score 2 0 0 0   Difficult doing work/chores Not difficult at all Not difficult at all Not difficult at all Not difficult at all         12/17/2022    2:25 PM 01/18/2023   10:18 AM 07/26/2023    8:09 AM 01/28/2024    8:05 AM 05/22/2024   10:05 AM  Fall Risk  Falls in the past year? 0 0 0 0 0  Was there an injury with Fall? 0 0 0 0 0  Fall Risk Category Calculator 0 0 0 0 0  Patient at Risk for Falls Due to No Fall Risks No Fall Risks No Fall Risks No Fall  Risks No Fall Risks  Fall risk Follow up Falls evaluation completed;Education provided Falls evaluation completed Falls evaluation completed Falls evaluation completed Falls evaluation completed     ROS CONSTITUTIONAL: Negative for chills, fatigue, fever,   CARDIOVASCULAR: Negative for chest pain, dizziness, palpitations and pedal edema.  RESPIRATORY: Negative for recent cough and dyspnea.  GASTROINTESTINAL: Negative for abdominal pain, acid reflux symptoms, constipation, diarrhea, nausea and vomiting.  MSK: see HPI INTEGUMENTARY: Negative for rash.  NEUROLOGICAL: Negative for dizziness and headaches.  PSYCHIATRIC: see HPI   Current Outpatient Medications:    aspirin 81 MG EC tablet, Take by mouth., Disp: , Rfl:    atorvastatin  (LIPITOR) 80 MG tablet, TAKE ONE TABLET BY MOUTH ONCE DAILY, Disp: 90 tablet, Rfl: 1   fluticasone  (FLONASE ) 50 MCG/ACT nasal spray, Place 2 sprays into both nostrils daily., Disp: 16 g, Rfl: 6   meloxicam  (MOBIC ) 7.5 MG tablet, TAKE ONE TABLET BY MOUTH DAILY, Disp: 90  tablet, Rfl: 1   Multiple Vitamins-Minerals (MULTI COMPLETE PO), Take 1 tablet by mouth daily., Disp: , Rfl:    nitroGLYCERIN  (NITROSTAT ) 0.4 MG SL tablet, Place 1 tablet (0.4 mg total) under the tongue every 5 (five) minutes as needed for chest pain., Disp: 25 tablet, Rfl: 1   omeprazole  (PRILOSEC) 40 MG capsule, Take 1 capsule (40 mg total) by mouth daily., Disp: 90 capsule, Rfl: 1   sertraline  (ZOLOFT ) 100 MG tablet, Take 1 tablet (100 mg total) by mouth daily., Disp: 90 tablet, Rfl: 1  Past Medical History:  Diagnosis Date   Aortic stenosis 12/19/2021   Chronic GERD    Coronary artery disease multiple borderline lesion by coronary CT angio by fractional flow reserve negative 07/20/2022   Depression    Hyperlipemia    Major depressive disorder, single episode, moderate (HCC)    Objective:  PHYSICAL EXAM:   BP 108/60   Pulse 100   Temp 98.3 F (36.8 C)   Ht 5' 11 (1.803 m)   Wt 234  lb 3.2 oz (106.2 kg)   SpO2 97%   BMI 32.66 kg/m    GEN: Well nourished, well developed, in no acute distress  Cardiac: RRR; no murmurs, rubs, or gallops,no edema - Respiratory:  normal respiratory rate and pattern with no distress - normal breath sounds with no rales, rhonchi, wheezes or rubs GI: normal bowel sounds, no masses or tenderness MS: no deformity or atrophy   Psych: euthymic mood, appropriate affect and demeanor    05/22/2024   10:37 AM 01/28/2024    8:05 AM 07/26/2023    8:09 AM 01/18/2023   10:18 AM 12/17/2022    2:20 PM  Depression screen PHQ 2/9  Decreased Interest 0 0 0 0 0  Down, Depressed, Hopeless 1 0  0 0  PHQ - 2 Score 1 0 0 0 0  Altered sleeping 0 0 0 0   Tired, decreased energy 1 0 0 0   Change in appetite 0 0 0 0   Feeling bad or failure about yourself  0 0 0 0   Trouble concentrating 0 0 0    Moving slowly or fidgety/restless 0 0 0 0   Suicidal thoughts 0 0 0 0   PHQ-9 Score 2 0 0 0   Difficult doing work/chores Not difficult at all Not difficult at all Not difficult at all Not difficult at all     Assessment & Plan:    Chest wall pain -     DG Chest 2 View; Future  Coronary artery disease of native artery of native heart with stable angina pectoris (HCC) Continue meds and follow up with cardiology as directed Depression with anxiety Continue zoloft  100mg  qd Gastroesophageal reflux disease without esophagitis -     Omeprazole ; Take 1 capsule (40 mg total) by mouth daily.  Dispense: 90 capsule; Refill: 1  Anxiety -     Sertraline  HCl; Take 1 tablet (100 mg total) by mouth daily.  Dispense: 90 tablet; Refill: 1     Follow-up: Return for as scheduled for chronic visit.  An After Visit Summary was printed and given to the patient.  [name] [facility] [phone]

## 2024-05-27 ENCOUNTER — Telehealth: Payer: Self-pay

## 2024-05-27 ENCOUNTER — Other Ambulatory Visit: Payer: Self-pay | Admitting: Physician Assistant

## 2024-05-27 DIAGNOSIS — F419 Anxiety disorder, unspecified: Secondary | ICD-10-CM

## 2024-05-27 MED ORDER — LORAZEPAM 0.5 MG PO TABS: 0.5000 mg | ORAL_TABLET | Freq: Every evening | ORAL | 0 refills | Status: DC | PRN

## 2024-05-27 NOTE — Telephone Encounter (Signed)
 Pt stopped by office - says is having trouble sleeping since wife passed away, feeling anxious, snappy Continue zoloft  100mg  qd and will send in rx for ativan  0.5mg  at bedtime prn

## 2024-05-27 NOTE — Telephone Encounter (Signed)
 Copied from CRM #[id number]. Topic: Clinical - Medical Advice >> May 27, 2024  1:33 PM Ivette P wrote: Reason for CRM: PT called in about not being able to sleep in the past 3-4 weeks and is wondering if anything could be prscribed to help him get some sleep. Would like to know what options he has   Pt callback [phone]

## 2024-06-04 ENCOUNTER — Ambulatory Visit (INDEPENDENT_AMBULATORY_CARE_PROVIDER_SITE_OTHER): Admitting: Physician Assistant

## 2024-06-04 ENCOUNTER — Encounter: Payer: Self-pay | Admitting: Physician Assistant

## 2024-06-04 VITALS — BP 124/68 | HR 80 | Temp 98.0°F | Resp 16 | Ht 71.0 in | Wt 231.8 lb

## 2024-06-04 DIAGNOSIS — J060 Acute laryngopharyngitis: Secondary | ICD-10-CM

## 2024-06-04 LAB — POC COVID19/FLU A&B COMBO
Covid Antigen, POC: NEGATIVE
Influenza A Antigen, POC: NEGATIVE
Influenza B Antigen, POC: NEGATIVE

## 2024-06-04 MED ORDER — AZITHROMYCIN 250 MG PO TABS: ORAL_TABLET | ORAL | 0 refills | Status: AC

## 2024-06-04 NOTE — Progress Notes (Signed)
 Acute Office Visit  Subjective:    Patient ID: Angel Orr, male    DOB: Feb 20, 1951, 73 y.o.   MRN: [id number]  Chief Complaint  Patient presents with   URI    HPI: Patient is in today for complaints of scratchy throat, sinus congestion and mild cough for the past several days and symptoms are worsening.  Denies fever but has had malaise.  Cough nonproductive Has had runny nose - taking flonase  but states not helpful   Current Outpatient Medications:    aspirin 81 MG EC tablet, Take by mouth., Disp: , Rfl:    atorvastatin  (LIPITOR) 80 MG tablet, TAKE ONE TABLET BY MOUTH ONCE DAILY, Disp: 90 tablet, Rfl: 1   azithromycin  (ZITHROMAX ) 250 MG tablet, Take 2 tablets on day 1, then 1 tablet daily on days 2 through 5, Disp: 6 tablet, Rfl: 0   loratadine (CLARITIN) 10 MG tablet, Take 10 mg by mouth daily., Disp: , Rfl:    LORazepam  (ATIVAN ) 0.5 MG tablet, Take 1 tablet (0.5 mg total) by mouth at bedtime as needed for anxiety., Disp: 30 tablet, Rfl: 0   meloxicam  (MOBIC ) 7.5 MG tablet, TAKE ONE TABLET BY MOUTH DAILY, Disp: 90 tablet, Rfl: 1   Multiple Vitamins-Minerals (MULTI COMPLETE PO), Take 1 tablet by mouth daily., Disp: , Rfl:    nitroGLYCERIN  (NITROSTAT ) 0.4 MG SL tablet, Place 1 tablet (0.4 mg total) under the tongue every 5 (five) minutes as needed for chest pain., Disp: 25 tablet, Rfl: 1   omeprazole  (PRILOSEC) 40 MG capsule, Take 1 capsule (40 mg total) by mouth daily., Disp: 90 capsule, Rfl: 1   sertraline  (ZOLOFT ) 100 MG tablet, Take 1 tablet (100 mg total) by mouth daily., Disp: 90 tablet, Rfl: 1  Allergies  Allergen Reactions   Aleve [Naproxen]     Pt can tolerate meloxicam    Codeine    Morphine     ROS CONSTITUTIONAL: Negative for chills, fatigue, fever,  E/N/T: see HPI CARDIOVASCULAR: Negative for chest pain, dizziness, palpitations and pedal edema.  RESPIRATORY: see HPI GASTROINTESTINAL: Negative for abdominal pain, acid reflux symptoms, constipation, diarrhea,  nausea and vomiting.  MSK: Negative for arthralgias and myalgias.  INTEGUMENTARY: Negative for rash.       Objective:    PHYSICAL EXAM:   BP 124/68   Pulse 80   Temp 98 F (36.7 C)   Resp 16   Ht 5' 11 (1.803 m)   Wt 231 lb 12.8 oz (105.1 kg)   SpO2 97%   BMI 32.33 kg/m    GEN: Well nourished, well developed, in no acute distress  HEENT: normal external ears and nose - normal external auditory canals and TMS - hearing grossly normal -- Lips, Teeth and Gums - normal  Oropharynx - erythema/pnd Cardiac: RRR; no murmurs, Respiratory:  normal respiratory rate and pattern with no distress - normal breath sounds with no rales, rhonchi, wheezes or rubs Psych: euthymic mood, appropriate affect and demeanor     Office Visit on 06/04/2024  Component Date Value Ref Range Status   Influenza A Antigen, POC 06/04/2024 Negative  Negative Final   Influenza B Antigen, POC 06/04/2024 Negative  Negative Final   Covid Antigen, POC 06/04/2024 Negative  Negative Final    Assessment & Plan:    Acute laryngopharyngitis -     POC Covid19/Flu A&B Antigen -     Azithromycin ; Take 2 tablets on day 1, then 1 tablet daily on days 2 through 5  Dispense: 6 tablet;  Refill: 0     Follow-up: Return if symptoms worsen or fail to improve.  An After Visit Summary was printed and given to the patient.  [facility] Orr [facility] [phone]

## 2024-06-24 ENCOUNTER — Emergency Department (HOSPITAL_COMMUNITY): Admission: EM | Admit: 2024-06-24 | Discharge: 2024-06-24 | Disposition: A | Discharge status: Home / Self Care

## 2024-06-24 ENCOUNTER — Other Ambulatory Visit: Payer: Self-pay

## 2024-06-24 ENCOUNTER — Emergency Department (HOSPITAL_COMMUNITY)

## 2024-06-24 DIAGNOSIS — I672 Cerebral atherosclerosis: Secondary | ICD-10-CM | POA: Diagnosis not present

## 2024-06-24 DIAGNOSIS — I7143 Infrarenal abdominal aortic aneurysm, without rupture: Secondary | ICD-10-CM | POA: Diagnosis not present

## 2024-06-24 DIAGNOSIS — M25562 Pain in left knee: Secondary | ICD-10-CM | POA: Diagnosis not present

## 2024-06-24 DIAGNOSIS — S82832A Other fracture of upper and lower end of left fibula, initial encounter for closed fracture: Secondary | ICD-10-CM | POA: Diagnosis not present

## 2024-06-24 DIAGNOSIS — S82042A Displaced comminuted fracture of left patella, initial encounter for closed fracture: Secondary | ICD-10-CM | POA: Insufficient documentation

## 2024-06-24 DIAGNOSIS — M545 Low back pain, unspecified: Secondary | ICD-10-CM | POA: Diagnosis not present

## 2024-06-24 DIAGNOSIS — I6782 Cerebral ischemia: Secondary | ICD-10-CM | POA: Diagnosis not present

## 2024-06-24 DIAGNOSIS — S80919A Unspecified superficial injury of unspecified knee, initial encounter: Secondary | ICD-10-CM | POA: Diagnosis not present

## 2024-06-24 DIAGNOSIS — S82002A Unspecified fracture of left patella, initial encounter for closed fracture: Secondary | ICD-10-CM | POA: Diagnosis not present

## 2024-06-24 DIAGNOSIS — K5792 Diverticulitis of intestine, part unspecified, without perforation or abscess without bleeding: Secondary | ICD-10-CM | POA: Diagnosis not present

## 2024-06-24 DIAGNOSIS — K5730 Diverticulosis of large intestine without perforation or abscess without bleeding: Secondary | ICD-10-CM | POA: Insufficient documentation

## 2024-06-24 DIAGNOSIS — Z7982 Long term (current) use of aspirin: Secondary | ICD-10-CM | POA: Insufficient documentation

## 2024-06-24 DIAGNOSIS — I700 Atherosclerosis of aorta: Secondary | ICD-10-CM | POA: Diagnosis not present

## 2024-06-24 DIAGNOSIS — R519 Headache, unspecified: Secondary | ICD-10-CM | POA: Diagnosis not present

## 2024-06-24 DIAGNOSIS — S0990XA Unspecified injury of head, initial encounter: Secondary | ICD-10-CM | POA: Insufficient documentation

## 2024-06-24 DIAGNOSIS — R609 Edema, unspecified: Secondary | ICD-10-CM | POA: Diagnosis not present

## 2024-06-24 DIAGNOSIS — Y92410 Unspecified street and highway as the place of occurrence of the external cause: Secondary | ICD-10-CM | POA: Insufficient documentation

## 2024-06-24 DIAGNOSIS — R9431 Abnormal electrocardiogram [ECG] [EKG]: Secondary | ICD-10-CM | POA: Diagnosis not present

## 2024-06-24 DIAGNOSIS — R42 Dizziness and giddiness: Secondary | ICD-10-CM | POA: Diagnosis not present

## 2024-06-24 DIAGNOSIS — D509 Iron deficiency anemia, unspecified: Secondary | ICD-10-CM

## 2024-06-24 DIAGNOSIS — S8992XA Unspecified injury of left lower leg, initial encounter: Secondary | ICD-10-CM | POA: Diagnosis present

## 2024-06-24 LAB — HEPATIC FUNCTION PANEL
ALT: 27 U/L (ref 0–44)
AST: 30 U/L (ref 15–41)
Albumin: 3.5 g/dL (ref 3.5–5.0)
Alkaline Phosphatase: 58 U/L (ref 38–126)
Bilirubin, Direct: <0.1 mg/dL (ref 0.0–0.2)
Total Bilirubin: 0.4 mg/dL (ref 0.0–1.2)
Total Protein: 7 g/dL (ref 6.5–8.1)

## 2024-06-24 LAB — CBC WITH DIFFERENTIAL/PLATELET
Abs Immature Granulocytes: 0.05 K/uL (ref 0.00–0.07)
Basophils Absolute: 0.1 K/uL (ref 0.0–0.1)
Basophils Relative: 1 %
Eosinophils Absolute: 0.2 K/uL (ref 0.0–0.5)
Eosinophils Relative: 2 %
HCT: 32 % — ABNORMAL LOW (ref 39.0–52.0)
Hemoglobin: 9.9 g/dL — ABNORMAL LOW (ref 13.0–17.0)
Immature Granulocytes: 1 %
Lymphocytes Relative: 17 %
Lymphs Abs: 1.6 K/uL (ref 0.7–4.0)
MCH: 27.1 pg (ref 26.0–34.0)
MCHC: 30.9 g/dL (ref 30.0–36.0)
MCV: 87.7 fL (ref 80.0–100.0)
Monocytes Absolute: 0.9 K/uL (ref 0.1–1.0)
Monocytes Relative: 10 %
Neutro Abs: 6.6 K/uL (ref 1.7–7.7)
Neutrophils Relative %: 69 %
Platelets: 247 K/uL (ref 150–400)
RBC: 3.65 MIL/uL — ABNORMAL LOW (ref 4.22–5.81)
RDW: 13.6 % (ref 11.5–15.5)
WBC: 9.4 K/uL (ref 4.0–10.5)
nRBC: 0 % (ref 0.0–0.2)

## 2024-06-24 LAB — URINALYSIS, ROUTINE W REFLEX MICROSCOPIC
Bilirubin Urine: NEGATIVE
Glucose, UA: NEGATIVE mg/dL
Hgb urine dipstick: NEGATIVE
Ketones, ur: NEGATIVE mg/dL
Leukocytes,Ua: NEGATIVE
Nitrite: NEGATIVE
Protein, ur: NEGATIVE mg/dL
Specific Gravity, Urine: 1.01 (ref 1.005–1.030)
pH: 6 (ref 5.0–8.0)

## 2024-06-24 LAB — I-STAT CHEM 8, ED
BUN: 23 mg/dL (ref 8–23)
Calcium, Ion: 1.21 mmol/L (ref 1.15–1.40)
Chloride: 103 mmol/L (ref 98–111)
Creatinine, Ser: 1.3 mg/dL — ABNORMAL HIGH (ref 0.61–1.24)
Glucose, Bld: 91 mg/dL (ref 70–99)
HCT: 32 % — ABNORMAL LOW (ref 39.0–52.0)
Hemoglobin: 10.9 g/dL — ABNORMAL LOW (ref 13.0–17.0)
Potassium: 4.3 mmol/L (ref 3.5–5.1)
Sodium: 137 mmol/L (ref 135–145)
TCO2: 24 mmol/L (ref 22–32)

## 2024-06-24 MED ORDER — IOHEXOL 350 MG/ML SOLN
75.0000 mL | Freq: Once | INTRAVENOUS | Status: AC | PRN
  Administered 2024-06-24: 75 mL via INTRAVENOUS

## 2024-06-24 NOTE — ED Notes (Signed)
 CCMD called.

## 2024-06-24 NOTE — Progress Notes (Signed)
 Orthopedic Tech Progress Note Patient Details:  Angel Orr 11-May-1951 [id number]  Ortho Devices Type of Ortho Device: Knee Immobilizer Ortho Device/Splint Location: LLE Ortho Device/Splint Interventions: Ordered, Application, Adjustment   Post Interventions Patient Tolerated: Well Instructions Provided: Care of device  [name] 06/24/2024, 4:43 PM

## 2024-06-24 NOTE — ED Notes (Signed)
 Delay in attempting labs due to high volume and acuity throughout this Rns assignment. Labs were attempted to be drawn x2 at this time and was unsuccessful. IV team consult placed.

## 2024-06-24 NOTE — Discharge Instructions (Addendum)
 Wear your knee immobilizer until you see orthopedic surgery.  Call the office tomorrow make a follow-up appointment.  Your home prescription for meloxicam  is 7.5 mg.  You can increase your dose by taking this twice a day instead of once a day.  Do not increase the dose more than this.  You can also take Tylenol 650 mg every 8 hours as needed for pain.

## 2024-06-24 NOTE — ED Notes (Signed)
Pt refusing C collar. MD aware
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
## 2024-06-25 DIAGNOSIS — M25562 Pain in left knee: Secondary | ICD-10-CM | POA: Diagnosis not present

## 2024-06-25 DIAGNOSIS — S82032D Displaced transverse fracture of left patella, subsequent encounter for closed fracture with routine healing: Secondary | ICD-10-CM | POA: Diagnosis not present

## 2024-07-02 DIAGNOSIS — M25562 Pain in left knee: Secondary | ICD-10-CM | POA: Diagnosis not present

## 2024-07-02 DIAGNOSIS — M25462 Effusion, left knee: Secondary | ICD-10-CM | POA: Diagnosis not present

## 2024-07-02 DIAGNOSIS — S82032K Displaced transverse fracture of left patella, subsequent encounter for closed fracture with nonunion: Secondary | ICD-10-CM | POA: Diagnosis not present

## 2024-07-02 DIAGNOSIS — S82032D Displaced transverse fracture of left patella, subsequent encounter for closed fracture with routine healing: Secondary | ICD-10-CM | POA: Diagnosis not present

## 2024-07-02 DIAGNOSIS — Z01810 Encounter for preprocedural cardiovascular examination: Secondary | ICD-10-CM | POA: Diagnosis not present

## 2024-07-02 DIAGNOSIS — Z01818 Encounter for other preprocedural examination: Secondary | ICD-10-CM | POA: Diagnosis not present

## 2024-07-08 DIAGNOSIS — S82032D Displaced transverse fracture of left patella, subsequent encounter for closed fracture with routine healing: Secondary | ICD-10-CM | POA: Diagnosis not present

## 2024-07-10 DIAGNOSIS — S82032A Displaced transverse fracture of left patella, initial encounter for closed fracture: Secondary | ICD-10-CM | POA: Diagnosis not present

## 2024-07-10 DIAGNOSIS — E7800 Pure hypercholesterolemia, unspecified: Secondary | ICD-10-CM | POA: Diagnosis not present

## 2024-07-10 DIAGNOSIS — Z79899 Other long term (current) drug therapy: Secondary | ICD-10-CM | POA: Diagnosis not present

## 2024-07-10 DIAGNOSIS — S82042A Displaced comminuted fracture of left patella, initial encounter for closed fracture: Secondary | ICD-10-CM | POA: Diagnosis not present

## 2024-07-10 DIAGNOSIS — Z885 Allergy status to narcotic agent status: Secondary | ICD-10-CM | POA: Diagnosis not present

## 2024-07-10 DIAGNOSIS — K219 Gastro-esophageal reflux disease without esophagitis: Secondary | ICD-10-CM | POA: Diagnosis not present

## 2024-07-10 DIAGNOSIS — M25562 Pain in left knee: Secondary | ICD-10-CM | POA: Diagnosis not present

## 2024-07-10 DIAGNOSIS — Z886 Allergy status to analgesic agent status: Secondary | ICD-10-CM | POA: Diagnosis not present

## 2024-07-10 DIAGNOSIS — Z87891 Personal history of nicotine dependence: Secondary | ICD-10-CM | POA: Diagnosis not present

## 2024-07-10 DIAGNOSIS — S82032K Displaced transverse fracture of left patella, subsequent encounter for closed fracture with nonunion: Secondary | ICD-10-CM | POA: Diagnosis not present

## 2024-07-11 DIAGNOSIS — M25562 Pain in left knee: Secondary | ICD-10-CM | POA: Diagnosis not present

## 2024-07-11 DIAGNOSIS — Z79899 Other long term (current) drug therapy: Secondary | ICD-10-CM | POA: Diagnosis not present

## 2024-07-11 DIAGNOSIS — K219 Gastro-esophageal reflux disease without esophagitis: Secondary | ICD-10-CM | POA: Diagnosis not present

## 2024-07-11 DIAGNOSIS — E7800 Pure hypercholesterolemia, unspecified: Secondary | ICD-10-CM | POA: Diagnosis not present

## 2024-07-11 DIAGNOSIS — Z885 Allergy status to narcotic agent status: Secondary | ICD-10-CM | POA: Diagnosis not present

## 2024-07-11 DIAGNOSIS — S82042A Displaced comminuted fracture of left patella, initial encounter for closed fracture: Secondary | ICD-10-CM | POA: Insufficient documentation

## 2024-07-12 DIAGNOSIS — S82042A Displaced comminuted fracture of left patella, initial encounter for closed fracture: Secondary | ICD-10-CM | POA: Diagnosis not present

## 2024-07-12 DIAGNOSIS — M25562 Pain in left knee: Secondary | ICD-10-CM | POA: Diagnosis not present

## 2024-07-12 DIAGNOSIS — E7800 Pure hypercholesterolemia, unspecified: Secondary | ICD-10-CM | POA: Diagnosis not present

## 2024-07-12 DIAGNOSIS — Z885 Allergy status to narcotic agent status: Secondary | ICD-10-CM | POA: Diagnosis not present

## 2024-07-12 DIAGNOSIS — Z79899 Other long term (current) drug therapy: Secondary | ICD-10-CM | POA: Diagnosis not present

## 2024-07-12 DIAGNOSIS — K219 Gastro-esophageal reflux disease without esophagitis: Secondary | ICD-10-CM | POA: Diagnosis not present

## 2024-07-14 DIAGNOSIS — F321 Major depressive disorder, single episode, moderate: Secondary | ICD-10-CM | POA: Diagnosis not present

## 2024-07-14 DIAGNOSIS — S82402D Unspecified fracture of shaft of left fibula, subsequent encounter for closed fracture with routine healing: Secondary | ICD-10-CM | POA: Diagnosis not present

## 2024-07-14 DIAGNOSIS — I350 Nonrheumatic aortic (valve) stenosis: Secondary | ICD-10-CM | POA: Diagnosis not present

## 2024-07-14 DIAGNOSIS — F418 Other specified anxiety disorders: Secondary | ICD-10-CM | POA: Diagnosis not present

## 2024-07-14 DIAGNOSIS — K5730 Diverticulosis of large intestine without perforation or abscess without bleeding: Secondary | ICD-10-CM | POA: Diagnosis not present

## 2024-07-14 DIAGNOSIS — Z87891 Personal history of nicotine dependence: Secondary | ICD-10-CM | POA: Diagnosis not present

## 2024-07-14 DIAGNOSIS — E785 Hyperlipidemia, unspecified: Secondary | ICD-10-CM | POA: Diagnosis not present

## 2024-07-14 DIAGNOSIS — I700 Atherosclerosis of aorta: Secondary | ICD-10-CM | POA: Diagnosis not present

## 2024-07-14 DIAGNOSIS — Z9181 History of falling: Secondary | ICD-10-CM | POA: Diagnosis not present

## 2024-07-14 DIAGNOSIS — J302 Other seasonal allergic rhinitis: Secondary | ICD-10-CM | POA: Diagnosis not present

## 2024-07-14 DIAGNOSIS — I7143 Infrarenal abdominal aortic aneurysm, without rupture: Secondary | ICD-10-CM | POA: Diagnosis not present

## 2024-07-14 DIAGNOSIS — Z7982 Long term (current) use of aspirin: Secondary | ICD-10-CM | POA: Diagnosis not present

## 2024-07-14 DIAGNOSIS — S82832D Other fracture of upper and lower end of left fibula, subsequent encounter for closed fracture with routine healing: Secondary | ICD-10-CM | POA: Diagnosis not present

## 2024-07-14 DIAGNOSIS — I2510 Atherosclerotic heart disease of native coronary artery without angina pectoris: Secondary | ICD-10-CM | POA: Diagnosis not present

## 2024-07-14 DIAGNOSIS — K219 Gastro-esophageal reflux disease without esophagitis: Secondary | ICD-10-CM | POA: Diagnosis not present

## 2024-07-22 DIAGNOSIS — Z4789 Encounter for other orthopedic aftercare: Secondary | ICD-10-CM | POA: Diagnosis not present

## 2024-07-23 DIAGNOSIS — I700 Atherosclerosis of aorta: Secondary | ICD-10-CM | POA: Diagnosis not present

## 2024-07-23 DIAGNOSIS — I2510 Atherosclerotic heart disease of native coronary artery without angina pectoris: Secondary | ICD-10-CM | POA: Diagnosis not present

## 2024-07-23 DIAGNOSIS — I7143 Infrarenal abdominal aortic aneurysm, without rupture: Secondary | ICD-10-CM | POA: Diagnosis not present

## 2024-07-23 DIAGNOSIS — S82402D Unspecified fracture of shaft of left fibula, subsequent encounter for closed fracture with routine healing: Secondary | ICD-10-CM | POA: Diagnosis not present

## 2024-07-23 DIAGNOSIS — I350 Nonrheumatic aortic (valve) stenosis: Secondary | ICD-10-CM | POA: Diagnosis not present

## 2024-07-23 DIAGNOSIS — S82832D Other fracture of upper and lower end of left fibula, subsequent encounter for closed fracture with routine healing: Secondary | ICD-10-CM | POA: Diagnosis not present

## 2024-07-27 DIAGNOSIS — I350 Nonrheumatic aortic (valve) stenosis: Secondary | ICD-10-CM | POA: Diagnosis not present

## 2024-07-27 DIAGNOSIS — I700 Atherosclerosis of aorta: Secondary | ICD-10-CM | POA: Diagnosis not present

## 2024-07-27 DIAGNOSIS — S82402D Unspecified fracture of shaft of left fibula, subsequent encounter for closed fracture with routine healing: Secondary | ICD-10-CM | POA: Diagnosis not present

## 2024-07-27 DIAGNOSIS — I7143 Infrarenal abdominal aortic aneurysm, without rupture: Secondary | ICD-10-CM | POA: Diagnosis not present

## 2024-07-27 DIAGNOSIS — S82832D Other fracture of upper and lower end of left fibula, subsequent encounter for closed fracture with routine healing: Secondary | ICD-10-CM | POA: Diagnosis not present

## 2024-07-27 DIAGNOSIS — I2510 Atherosclerotic heart disease of native coronary artery without angina pectoris: Secondary | ICD-10-CM | POA: Diagnosis not present

## 2024-07-29 DIAGNOSIS — Z8249 Family history of ischemic heart disease and other diseases of the circulatory system: Secondary | ICD-10-CM | POA: Diagnosis not present

## 2024-07-29 DIAGNOSIS — R457 State of emotional shock and stress, unspecified: Secondary | ICD-10-CM | POA: Diagnosis not present

## 2024-07-29 DIAGNOSIS — I10 Essential (primary) hypertension: Secondary | ICD-10-CM | POA: Diagnosis not present

## 2024-07-29 DIAGNOSIS — F32A Depression, unspecified: Secondary | ICD-10-CM | POA: Diagnosis present

## 2024-07-29 DIAGNOSIS — R7989 Other specified abnormal findings of blood chemistry: Secondary | ICD-10-CM | POA: Insufficient documentation

## 2024-07-29 DIAGNOSIS — I2693 Single subsegmental pulmonary embolism without acute cor pulmonale: Secondary | ICD-10-CM | POA: Diagnosis not present

## 2024-07-29 DIAGNOSIS — E66811 Obesity, class 1: Secondary | ICD-10-CM | POA: Diagnosis not present

## 2024-07-29 DIAGNOSIS — K219 Gastro-esophageal reflux disease without esophagitis: Secondary | ICD-10-CM | POA: Diagnosis present

## 2024-07-29 DIAGNOSIS — I2510 Atherosclerotic heart disease of native coronary artery without angina pectoris: Secondary | ICD-10-CM | POA: Diagnosis not present

## 2024-07-29 DIAGNOSIS — Z7982 Long term (current) use of aspirin: Secondary | ICD-10-CM | POA: Diagnosis not present

## 2024-07-29 DIAGNOSIS — S82042A Displaced comminuted fracture of left patella, initial encounter for closed fracture: Secondary | ICD-10-CM | POA: Diagnosis not present

## 2024-07-29 DIAGNOSIS — Z886 Allergy status to analgesic agent status: Secondary | ICD-10-CM | POA: Diagnosis not present

## 2024-07-29 DIAGNOSIS — Z0389 Encounter for observation for other suspected diseases and conditions ruled out: Secondary | ICD-10-CM | POA: Diagnosis not present

## 2024-07-29 DIAGNOSIS — D649 Anemia, unspecified: Secondary | ICD-10-CM | POA: Insufficient documentation

## 2024-07-29 DIAGNOSIS — I517 Cardiomegaly: Secondary | ICD-10-CM | POA: Diagnosis not present

## 2024-07-29 DIAGNOSIS — T81718A Complication of other artery following a procedure, not elsewhere classified, initial encounter: Secondary | ICD-10-CM | POA: Diagnosis present

## 2024-07-29 DIAGNOSIS — Z888 Allergy status to other drugs, medicaments and biological substances status: Secondary | ICD-10-CM | POA: Diagnosis not present

## 2024-07-29 DIAGNOSIS — Z043 Encounter for examination and observation following other accident: Secondary | ICD-10-CM | POA: Diagnosis not present

## 2024-07-29 DIAGNOSIS — I2699 Other pulmonary embolism without acute cor pulmonale: Secondary | ICD-10-CM | POA: Insufficient documentation

## 2024-07-29 DIAGNOSIS — E785 Hyperlipidemia, unspecified: Secondary | ICD-10-CM | POA: Diagnosis not present

## 2024-07-29 DIAGNOSIS — W19XXXA Unspecified fall, initial encounter: Secondary | ICD-10-CM | POA: Diagnosis not present

## 2024-07-29 DIAGNOSIS — R55 Syncope and collapse: Secondary | ICD-10-CM | POA: Diagnosis not present

## 2024-07-29 DIAGNOSIS — I2694 Multiple subsegmental pulmonary emboli without acute cor pulmonale: Secondary | ICD-10-CM | POA: Diagnosis not present

## 2024-07-29 DIAGNOSIS — K922 Gastrointestinal hemorrhage, unspecified: Secondary | ICD-10-CM | POA: Diagnosis not present

## 2024-07-29 DIAGNOSIS — Z79899 Other long term (current) drug therapy: Secondary | ICD-10-CM | POA: Diagnosis not present

## 2024-07-29 DIAGNOSIS — M85862 Other specified disorders of bone density and structure, left lower leg: Secondary | ICD-10-CM | POA: Diagnosis not present

## 2024-07-29 DIAGNOSIS — R410 Disorientation, unspecified: Secondary | ICD-10-CM | POA: Diagnosis not present

## 2024-07-29 DIAGNOSIS — Z885 Allergy status to narcotic agent status: Secondary | ICD-10-CM | POA: Diagnosis not present

## 2024-07-29 DIAGNOSIS — S82042S Displaced comminuted fracture of left patella, sequela: Secondary | ICD-10-CM | POA: Diagnosis not present

## 2024-07-29 DIAGNOSIS — I6782 Cerebral ischemia: Secondary | ICD-10-CM | POA: Diagnosis not present

## 2024-07-29 DIAGNOSIS — S82042D Displaced comminuted fracture of left patella, subsequent encounter for closed fracture with routine healing: Secondary | ICD-10-CM | POA: Diagnosis not present

## 2024-07-29 DIAGNOSIS — I110 Hypertensive heart disease with heart failure: Secondary | ICD-10-CM | POA: Diagnosis not present

## 2024-07-29 DIAGNOSIS — Z87891 Personal history of nicotine dependence: Secondary | ICD-10-CM | POA: Diagnosis not present

## 2024-07-29 DIAGNOSIS — E7800 Pure hypercholesterolemia, unspecified: Secondary | ICD-10-CM | POA: Diagnosis not present

## 2024-07-29 DIAGNOSIS — Z6832 Body mass index (BMI) 32.0-32.9, adult: Secondary | ICD-10-CM | POA: Diagnosis not present

## 2024-07-29 DIAGNOSIS — F419 Anxiety disorder, unspecified: Secondary | ICD-10-CM | POA: Diagnosis present

## 2024-07-29 DIAGNOSIS — M25462 Effusion, left knee: Secondary | ICD-10-CM | POA: Diagnosis not present

## 2024-07-29 DIAGNOSIS — S0990XA Unspecified injury of head, initial encounter: Secondary | ICD-10-CM | POA: Diagnosis not present

## 2024-07-29 DIAGNOSIS — R110 Nausea: Secondary | ICD-10-CM | POA: Diagnosis not present

## 2024-07-31 ENCOUNTER — Ambulatory Visit: Admitting: Physician Assistant

## 2024-08-01 DIAGNOSIS — D649 Anemia, unspecified: Secondary | ICD-10-CM | POA: Diagnosis not present

## 2024-08-01 DIAGNOSIS — Z86718 Personal history of other venous thrombosis and embolism: Secondary | ICD-10-CM | POA: Diagnosis not present

## 2024-08-01 DIAGNOSIS — E669 Obesity, unspecified: Secondary | ICD-10-CM | POA: Diagnosis not present

## 2024-08-01 DIAGNOSIS — Z6832 Body mass index (BMI) 32.0-32.9, adult: Secondary | ICD-10-CM | POA: Diagnosis not present

## 2024-08-01 DIAGNOSIS — R0602 Shortness of breath: Secondary | ICD-10-CM | POA: Diagnosis not present

## 2024-08-01 DIAGNOSIS — Z7901 Long term (current) use of anticoagulants: Secondary | ICD-10-CM | POA: Diagnosis not present

## 2024-08-01 DIAGNOSIS — E7800 Pure hypercholesterolemia, unspecified: Secondary | ICD-10-CM | POA: Diagnosis not present

## 2024-08-01 DIAGNOSIS — Z87891 Personal history of nicotine dependence: Secondary | ICD-10-CM | POA: Diagnosis not present

## 2024-08-01 DIAGNOSIS — Z9981 Dependence on supplemental oxygen: Secondary | ICD-10-CM | POA: Diagnosis not present

## 2024-08-03 DIAGNOSIS — I2510 Atherosclerotic heart disease of native coronary artery without angina pectoris: Secondary | ICD-10-CM | POA: Diagnosis not present

## 2024-08-03 DIAGNOSIS — S82832D Other fracture of upper and lower end of left fibula, subsequent encounter for closed fracture with routine healing: Secondary | ICD-10-CM | POA: Diagnosis not present

## 2024-08-03 DIAGNOSIS — I350 Nonrheumatic aortic (valve) stenosis: Secondary | ICD-10-CM | POA: Diagnosis not present

## 2024-08-03 DIAGNOSIS — I7143 Infrarenal abdominal aortic aneurysm, without rupture: Secondary | ICD-10-CM | POA: Diagnosis not present

## 2024-08-03 DIAGNOSIS — I700 Atherosclerosis of aorta: Secondary | ICD-10-CM | POA: Diagnosis not present

## 2024-08-03 DIAGNOSIS — S82402D Unspecified fracture of shaft of left fibula, subsequent encounter for closed fracture with routine healing: Secondary | ICD-10-CM | POA: Diagnosis not present

## 2024-08-13 ENCOUNTER — Encounter: Payer: Self-pay | Admitting: Physician Assistant

## 2024-08-13 ENCOUNTER — Other Ambulatory Visit: Payer: Self-pay | Admitting: Physician Assistant

## 2024-08-13 ENCOUNTER — Ambulatory Visit (INDEPENDENT_AMBULATORY_CARE_PROVIDER_SITE_OTHER): Admitting: Physician Assistant

## 2024-08-13 VITALS — BP 108/60 | HR 88 | Temp 98.5°F | Resp 18 | Ht 71.0 in | Wt 231.0 lb

## 2024-08-13 DIAGNOSIS — Z9181 History of falling: Secondary | ICD-10-CM | POA: Diagnosis not present

## 2024-08-13 DIAGNOSIS — F321 Major depressive disorder, single episode, moderate: Secondary | ICD-10-CM | POA: Diagnosis not present

## 2024-08-13 DIAGNOSIS — Z23 Encounter for immunization: Secondary | ICD-10-CM

## 2024-08-13 DIAGNOSIS — K219 Gastro-esophageal reflux disease without esophagitis: Secondary | ICD-10-CM | POA: Diagnosis not present

## 2024-08-13 DIAGNOSIS — I2510 Atherosclerotic heart disease of native coronary artery without angina pectoris: Secondary | ICD-10-CM | POA: Diagnosis not present

## 2024-08-13 DIAGNOSIS — D509 Iron deficiency anemia, unspecified: Secondary | ICD-10-CM | POA: Diagnosis not present

## 2024-08-13 DIAGNOSIS — S82042K Displaced comminuted fracture of left patella, subsequent encounter for closed fracture with nonunion: Secondary | ICD-10-CM

## 2024-08-13 DIAGNOSIS — I350 Nonrheumatic aortic (valve) stenosis: Secondary | ICD-10-CM | POA: Diagnosis not present

## 2024-08-13 DIAGNOSIS — Z79899 Other long term (current) drug therapy: Secondary | ICD-10-CM | POA: Diagnosis not present

## 2024-08-13 DIAGNOSIS — I2694 Multiple subsegmental pulmonary emboli without acute cor pulmonale: Secondary | ICD-10-CM | POA: Diagnosis not present

## 2024-08-13 DIAGNOSIS — S82042D Displaced comminuted fracture of left patella, subsequent encounter for closed fracture with routine healing: Secondary | ICD-10-CM | POA: Diagnosis not present

## 2024-08-13 DIAGNOSIS — I10 Essential (primary) hypertension: Secondary | ICD-10-CM | POA: Diagnosis not present

## 2024-08-13 DIAGNOSIS — I2699 Other pulmonary embolism without acute cor pulmonale: Secondary | ICD-10-CM | POA: Diagnosis not present

## 2024-08-13 DIAGNOSIS — E782 Mixed hyperlipidemia: Secondary | ICD-10-CM

## 2024-08-13 DIAGNOSIS — E7800 Pure hypercholesterolemia, unspecified: Secondary | ICD-10-CM | POA: Diagnosis not present

## 2024-08-13 DIAGNOSIS — Z87891 Personal history of nicotine dependence: Secondary | ICD-10-CM | POA: Diagnosis not present

## 2024-08-13 DIAGNOSIS — K5731 Diverticulosis of large intestine without perforation or abscess with bleeding: Secondary | ICD-10-CM | POA: Diagnosis not present

## 2024-08-13 DIAGNOSIS — Z6832 Body mass index (BMI) 32.0-32.9, adult: Secondary | ICD-10-CM | POA: Diagnosis not present

## 2024-08-13 DIAGNOSIS — I7143 Infrarenal abdominal aortic aneurysm, without rupture: Secondary | ICD-10-CM | POA: Diagnosis not present

## 2024-08-13 DIAGNOSIS — D649 Anemia, unspecified: Secondary | ICD-10-CM | POA: Diagnosis not present

## 2024-08-13 DIAGNOSIS — I700 Atherosclerosis of aorta: Secondary | ICD-10-CM | POA: Diagnosis not present

## 2024-08-13 DIAGNOSIS — F418 Other specified anxiety disorders: Secondary | ICD-10-CM | POA: Diagnosis not present

## 2024-08-13 DIAGNOSIS — E669 Obesity, unspecified: Secondary | ICD-10-CM | POA: Diagnosis not present

## 2024-08-13 DIAGNOSIS — J302 Other seasonal allergic rhinitis: Secondary | ICD-10-CM | POA: Diagnosis not present

## 2024-08-13 NOTE — Progress Notes (Signed)
 Subjective:  Patient ID: Angel Orr, male    DOB: 07/16/1951  Age: 73 y.o. MRN: [id number]  Chief Complaint  Patient presents with   Hospitalization Follow-up    HPI Follow up Hospitalization  Patient was admitted to [facility] on 07/29/24 and discharged on 07/31/24. He was treated for acute PE -- pt had left knee surgery on 07/10/24 by Dr Georgina in [facility] for a closed displaced communuted fracture of left patella.He had a syncopal spell and began having shortness of breath and was transported by EMS to hospital.  He was found to have multiple small PE s - echocardiogram done was normal and doppler of right leg normal - doppler of left leg could not be done due to full leg cast Pt was also found to be anemic and had blood transfusion - he has a GI follow up in December but did not have any other GI evaluation done in hospital (endoscopy or colonoscopy) - pt denies blood in bowels Treatment included blood transfusion - he also was switched to protonix 40mg  bid  Pt was discharged on Eliquis and is now taking 5mg  bid.  He reports excellent compliance with treatment. He reports this condition is improved.  Pt would like flu shot today    05/22/2024   10:37 AM 01/28/2024    8:05 AM 07/26/2023    8:09 AM 01/18/2023   10:18 AM 12/17/2022    2:20 PM  Depression screen PHQ 2/9  Decreased Interest 0 0 0 0 0  Down, Depressed, Hopeless 1 0  0 0  PHQ - 2 Score 1 0 0 0 0  Altered sleeping 0 0 0 0   Tired, decreased energy 1 0 0 0   Change in appetite 0 0 0 0   Feeling bad or failure about yourself  0 0 0 0   Trouble concentrating 0 0 0    Moving slowly or fidgety/restless 0 0 0 0   Suicidal thoughts 0 0 0 0   PHQ-9 Score 2 0 0 0   Difficult doing work/chores Not difficult at all Not difficult at all Not difficult at all Not difficult at all         12/17/2022    2:25 PM 01/18/2023   10:18 AM 07/26/2023    8:09 AM 01/28/2024    8:05 AM 05/22/2024   10:05 AM  Fall Risk  Falls in the  past year? 0 0 0 0 0  Was there an injury with Fall? 0 0 0 0 0  Fall Risk Category Calculator 0 0 0 0 0  Patient at Risk for Falls Due to No Fall Risks No Fall Risks No Fall Risks No Fall Risks No Fall Risks  Fall risk Follow up Falls evaluation completed;Education provided Falls evaluation completed Falls evaluation completed Falls evaluation completed Falls evaluation completed     ROS CONSTITUTIONAL: Negative for chills, fatigue, fever, unintentional weight gain and unintentional weight loss.  E/N/T: Negative for ear pain, nasal congestion and sore throat.  CARDIOVASCULAR: Negative for chest pain, dizziness, palpitations and pedal edema.  RESPIRATORY: Negative for recent cough and dyspnea.  GASTROINTESTINAL: Negative for abdominal pain, acid reflux symptoms, constipation, diarrhea, nausea and vomiting.  MSK: see HPI INTEGUMENTARY: Negative for rash.  NEUROLOGICAL: Negative for dizziness and headaches.  PSYCHIATRIC: Negative for sleep disturbance and to question depression screen.  Negative for depression, negative for anhedonia.    Current Outpatient Medications:    apixaban (ELIQUIS) 5 MG TABS tablet, Take 5  mg by mouth 2 (two) times daily., Disp: , Rfl:    atorvastatin  (LIPITOR) 80 MG tablet, TAKE ONE TABLET BY MOUTH ONCE DAILY, Disp: 90 tablet, Rfl: 1   Ferrous Sulfate Dried (FEOSOL) 200 (65 Fe) MG TABS, Take 1 tablet by mouth daily with breakfast., Disp: , Rfl:    loratadine (CLARITIN) 10 MG tablet, Take 10 mg by mouth daily., Disp: , Rfl:    LORazepam  (ATIVAN ) 0.5 MG tablet, Take 1 tablet (0.5 mg total) by mouth at bedtime as needed for anxiety., Disp: 30 tablet, Rfl: 0   Multiple Vitamins-Minerals (MULTI COMPLETE PO), Take 1 tablet by mouth daily., Disp: , Rfl:    nitroGLYCERIN  (NITROSTAT ) 0.4 MG SL tablet, Place 1 tablet (0.4 mg total) under the tongue every 5 (five) minutes as needed for chest pain., Disp: 25 tablet, Rfl: 1   pantoprazole (PROTONIX) 40 MG tablet, Take 40 mg by  mouth 2 (two) times daily., Disp: , Rfl:    sertraline  (ZOLOFT ) 100 MG tablet, Take 1 tablet (100 mg total) by mouth daily., Disp: 90 tablet, Rfl: 1  Past Medical History:  Diagnosis Date   Aortic stenosis 12/19/2021   Chronic GERD    Coronary artery disease multiple borderline lesion by coronary CT angio by fractional flow reserve negative 07/20/2022   Depression    Hyperlipemia    Major depressive disorder, single episode, moderate (HCC)    Objective:  PHYSICAL EXAM:   BP 108/60   Pulse 88   Temp 98.5 F (36.9 C) (Temporal)   Resp 18   Ht 5' 11 (1.803 m)   Wt 231 lb (104.8 kg)   SpO2 99%   BMI 32.22 kg/m  BP Readings from Last 3 Encounters:  08/13/24 108/60  06/24/24 (!) 168/80  06/04/24 124/68      GEN: Well nourished, well developed, in no acute distress  Cardiac: RRR; no murmurs, rubs, or gallops,no edema -left leg in full cast Respiratory:  normal respiratory rate and pattern with no distress - normal breath sounds with no rales, rhonchi, wheezes or rubs GI: normal bowel sounds, no masses or tenderness Skin: warm and dry, no rash  Psych: euthymic mood, appropriate affect and demeanor  Assessment & Plan:    Iron deficiency anemia, unspecified iron deficiency anemia type -     Fe+CBC/D/Plt+TIBC+Fer+Retic -     Comprehensive metabolic panel with GFR Hemoccult cards given 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  An After Visit Summary was printed and given to the patient.  [name] [facility] [phone]

## 2024-08-14 ENCOUNTER — Other Ambulatory Visit: Payer: Self-pay | Admitting: Physician Assistant

## 2024-08-14 ENCOUNTER — Other Ambulatory Visit: Payer: Self-pay

## 2024-08-14 ENCOUNTER — Ambulatory Visit: Payer: Self-pay | Admitting: Physician Assistant

## 2024-08-14 DIAGNOSIS — I2510 Atherosclerotic heart disease of native coronary artery without angina pectoris: Secondary | ICD-10-CM | POA: Diagnosis not present

## 2024-08-14 DIAGNOSIS — I10 Essential (primary) hypertension: Secondary | ICD-10-CM | POA: Diagnosis not present

## 2024-08-14 DIAGNOSIS — I2699 Other pulmonary embolism without acute cor pulmonale: Secondary | ICD-10-CM | POA: Diagnosis not present

## 2024-08-14 DIAGNOSIS — R531 Weakness: Secondary | ICD-10-CM | POA: Diagnosis not present

## 2024-08-14 DIAGNOSIS — I350 Nonrheumatic aortic (valve) stenosis: Secondary | ICD-10-CM | POA: Diagnosis not present

## 2024-08-14 DIAGNOSIS — S82042D Displaced comminuted fracture of left patella, subsequent encounter for closed fracture with routine healing: Secondary | ICD-10-CM | POA: Diagnosis not present

## 2024-08-14 DIAGNOSIS — D509 Iron deficiency anemia, unspecified: Secondary | ICD-10-CM

## 2024-08-14 DIAGNOSIS — I7143 Infrarenal abdominal aortic aneurysm, without rupture: Secondary | ICD-10-CM | POA: Diagnosis not present

## 2024-08-14 DIAGNOSIS — R0789 Other chest pain: Secondary | ICD-10-CM | POA: Diagnosis not present

## 2024-08-14 DIAGNOSIS — Z87891 Personal history of nicotine dependence: Secondary | ICD-10-CM | POA: Diagnosis not present

## 2024-08-14 DIAGNOSIS — Z86711 Personal history of pulmonary embolism: Secondary | ICD-10-CM | POA: Diagnosis not present

## 2024-08-14 LAB — FE+CBC/D/PLT+TIBC+FER+RETIC
Basophils Absolute: 0.1 x10E3/uL (ref 0.0–0.2)
Basos: 1 %
EOS (ABSOLUTE): 0.2 x10E3/uL (ref 0.0–0.4)
Eos: 3 %
Ferritin: 66 ng/mL (ref 30–400)
Hematocrit: 34.7 % — ABNORMAL LOW (ref 37.5–51.0)
Hemoglobin: 10.5 g/dL — ABNORMAL LOW (ref 13.0–17.7)
Immature Grans (Abs): 0 x10E3/uL (ref 0.0–0.1)
Immature Granulocytes: 0 %
Iron Saturation: 21 % (ref 15–55)
Iron: 85 ug/dL (ref 38–169)
Lymphocytes Absolute: 1.4 x10E3/uL (ref 0.7–3.1)
Lymphs: 20 %
MCH: 26.6 pg (ref 26.6–33.0)
MCHC: 30.3 g/dL — ABNORMAL LOW (ref 31.5–35.7)
MCV: 88 fL (ref 79–97)
Monocytes Absolute: 0.5 x10E3/uL (ref 0.1–0.9)
Monocytes: 8 %
Neutrophils Absolute: 4.7 x10E3/uL (ref 1.4–7.0)
Neutrophils: 68 %
Platelets: 286 x10E3/uL (ref 150–450)
RBC: 3.95 x10E6/uL — ABNORMAL LOW (ref 4.14–5.80)
RDW: 16.5 % — ABNORMAL HIGH (ref 11.6–15.4)
Retic Ct Pct: 2 % (ref 0.6–2.6)
Total Iron Binding Capacity: 398 ug/dL (ref 250–450)
UIBC: 313 ug/dL (ref 111–343)
WBC: 6.8 x10E3/uL (ref 3.4–10.8)

## 2024-08-14 LAB — COMPREHENSIVE METABOLIC PANEL WITH GFR
ALT: 22 IU/L (ref 0–44)
AST: 24 IU/L (ref 0–40)
Albumin: 4.2 g/dL (ref 3.8–4.8)
Alkaline Phosphatase: 115 IU/L (ref 47–123)
BUN/Creatinine Ratio: 17 (ref 10–24)
BUN: 20 mg/dL (ref 8–27)
Bilirubin Total: 0.4 mg/dL (ref 0.0–1.2)
CO2: 21 mmol/L (ref 20–29)
Calcium: 9.4 mg/dL (ref 8.6–10.2)
Chloride: 103 mmol/L (ref 96–106)
Creatinine, Ser: 1.21 mg/dL (ref 0.76–1.27)
Globulin, Total: 2.6 g/dL (ref 1.5–4.5)
Glucose: 101 mg/dL — ABNORMAL HIGH (ref 70–99)
Potassium: 4.4 mmol/L (ref 3.5–5.2)
Sodium: 140 mmol/L (ref 134–144)
Total Protein: 6.8 g/dL (ref 6.0–8.5)
eGFR: 63 mL/min/1.73 (ref >59)

## 2024-08-14 MED ORDER — APIXABAN 5 MG PO TABS: 5.0000 mg | ORAL_TABLET | Freq: Two times a day (BID) | ORAL | 1 refills | Status: DC

## 2024-08-17 ENCOUNTER — Ambulatory Visit: Payer: Self-pay

## 2024-08-17 DIAGNOSIS — I7143 Infrarenal abdominal aortic aneurysm, without rupture: Secondary | ICD-10-CM | POA: Diagnosis not present

## 2024-08-17 DIAGNOSIS — I2699 Other pulmonary embolism without acute cor pulmonale: Secondary | ICD-10-CM | POA: Diagnosis not present

## 2024-08-17 DIAGNOSIS — S82042D Displaced comminuted fracture of left patella, subsequent encounter for closed fracture with routine healing: Secondary | ICD-10-CM | POA: Diagnosis not present

## 2024-08-17 DIAGNOSIS — I350 Nonrheumatic aortic (valve) stenosis: Secondary | ICD-10-CM | POA: Diagnosis not present

## 2024-08-17 DIAGNOSIS — I10 Essential (primary) hypertension: Secondary | ICD-10-CM | POA: Diagnosis not present

## 2024-08-17 DIAGNOSIS — I2510 Atherosclerotic heart disease of native coronary artery without angina pectoris: Secondary | ICD-10-CM | POA: Diagnosis not present

## 2024-08-17 NOTE — Telephone Encounter (Signed)
 Patient's PT assistant called with concerns for patient's blood pressure which is 162/80. States patient was very worked up when she arrived. Patient stated he felt quite anxious. Very jittery and couldn't calm down. PT assistant stated patient was able to calm down after reading his Bible. Patient states he thinks his medication needs to be adjusted for anxiety. Patient was started on amlodipine recently during an ED visit. Patient is scheduled for an acute visit tomorrow at 8:40 AM.   FYI Only or Action Required?: FYI only for provider.  Patient was last seen in primary care on 08/13/2024 by Nicholaus Credit, PA-C.  Called Nurse Triage reporting Anxiety and Hypertension.  Symptoms began several days ago.  Interventions attempted: Prescription medications: Amlodipine and Rest, hydration, or home remedies.  Symptoms are: unchanged.  Triage Disposition: See PCP When Office is Open (Within 3 Days)  Patient/caregiver understands and will follow disposition?: Yes  Copied from CRM [id number]. Topic: Clinical - Medical Advice >> Aug 17, 2024  4:06 PM Winona SAUNDERS wrote: [facility] with the pt. Pt was seen in ER Oct 24th for chest pain and  high blood pressure , today his blood pressure is elevated at 162/80, but once he reads his bible and or sit and relax his BP seems to go down.  Pt believes its anxiety and the PT assistance wanted to speak with someone about possible anxiety medication while with the pt Reason for Disposition  Systolic BP >= 160 OR Diastolic >= 100  MODERATE anxiety (e.g., persistent or frequent anxiety symptoms; interferes with sleep, school, or work)  Answer Assessment - Initial Assessment Questions 1. CONCERN: Did anything happen that prompted you to call today?      PT assistant called in with concerns about blood pressure and anxiety. Patient gets nervous and uptight when people come to visit him. Patient did go to the ED on  10/24. 2. ANXIETY SYMPTOMS: Can you describe how you (your loved one; patient) have been feeling? (e.g., tense, restless, panicky, anxious, keyed up, overwhelmed, sense of impending doom).      anxious 3. ONSET: How long have you been feeling this way? (e.g., hours, days, weeks)     Two weeks 4. SEVERITY: How would you rate the level of anxiety? (e.g., 0 - 10; or mild, moderate, severe).     moderate 5. FUNCTIONAL IMPAIRMENT: How have these feelings affected your ability to do daily activities? Have you had more difficulty than usual doing your normal daily activities? (e.g., getting better, same, worse; self-care, school, work, interactions)     Hard to say if anxiety is causing any issues because he has a long cast on his leg.  6. HISTORY: Have you felt this way before? Have you ever been diagnosed with an anxiety problem in the past? (e.g., generalized anxiety disorder, panic attacks, PTSD). If Yes, ask: How was this problem treated? (e.g., medicines, counseling, etc.)     yes 7. RISK OF HARM - SUICIDAL IDEATION: Do you ever have thoughts of hurting or killing yourself? If Yes, ask:  Do you have these feelings now? Do you have a plan on how you would do this?     no 8. TREATMENT:  What has been done so far to treat this anxiety? (e.g., medicines, relaxation strategies). What has helped?     medication 9. THERAPIST: Do you have a counselor or therapist? If Yes, ask: What is their name?     NA 10.  POTENTIAL TRIGGERS: Do you drink caffeinated beverages (e.g., coffee, colas, teas), and how much daily? Do you drink alcohol or use any drugs? Have you started any new medicines recently?       One cup of coffee, no alcohol or drug use 11. PATIENT SUPPORT: Who is with you now? Who do you live with? Do you have family or friends who you can talk to?        Has family that will come visit.  12. OTHER SYMPTOMS: Do you have any other symptoms? (e.g., feeling  depressed, trouble concentrating, trouble sleeping, trouble breathing, palpitations or fast heartbeat, chest pain, sweating, nausea, or diarrhea)       Not sleeping well.  Answer Assessment - Initial Assessment Questions 1. BLOOD PRESSURE: What is your blood pressure? Did you take at least two measurements 5 minutes apart?     162/80,  162/62 2. ONSET: When did you take your blood pressure?     Home health took pressure prior to calling 3. HOW: How did you take your blood pressure? (e.g., automatic home BP monitor, visiting nurse)     Visiting PT assistant 4. HISTORY: Do you have a history of high blood pressure?     yes 5. MEDICINES: Are you taking any medicines for blood pressure? Have you missed any doses recently?     Started on Amlodipine on 10/24 6. OTHER SYMPTOMS: Do you have any symptoms? (e.g., blurred vision, chest pain, difficulty breathing, headache, weakness)     no  Protocols used: Anxiety and Panic Attack-A-AH, Blood Pressure - High-A-AH
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
## 2024-08-18 ENCOUNTER — Encounter: Payer: Self-pay | Admitting: Physician Assistant

## 2024-08-18 ENCOUNTER — Ambulatory Visit: Admitting: Physician Assistant

## 2024-08-18 VITALS — BP 110/62 | HR 97 | Temp 97.8°F | Ht 71.0 in | Wt 235.0 lb

## 2024-08-18 DIAGNOSIS — I10 Essential (primary) hypertension: Secondary | ICD-10-CM | POA: Diagnosis not present

## 2024-08-18 DIAGNOSIS — F419 Anxiety disorder, unspecified: Secondary | ICD-10-CM | POA: Diagnosis not present

## 2024-08-18 MED ORDER — LORAZEPAM 0.5 MG PO TABS: 0.5000 mg | ORAL_TABLET | Freq: Every evening | ORAL | 0 refills | Status: DC | PRN

## 2024-08-18 MED ORDER — BUSPIRONE HCL 5 MG PO TABS: 5.0000 mg | ORAL_TABLET | Freq: Every day | ORAL | 0 refills | Status: DC

## 2024-08-18 NOTE — Assessment & Plan Note (Signed)
 Hypertension Hypertension well-controlled with amlodipine. Current blood pressure 110/62. - Continue current amlodipine 10mg  regimen. - Will adjust medicine depending on symptoms BP Readings from Last 3 Encounters:  08/18/24 110/62  08/13/24 108/60  06/24/24 (!) 168/80

## 2024-08-18 NOTE — Assessment & Plan Note (Signed)
 Anxiety and Insomnia Chronic anxiety and insomnia exacerbated by recent life events. Zoloft  may have reduced efficacy. Over-the-counter sleep aids ineffective. - Prescribed Buspar 5 mg in the morning with Zoloft . - Restarted Ativan  for nighttime use. - Instructed to return if symptoms persist after Ativan  for medication adjustment. Orders:   busPIRone (BUSPAR) 5 MG tablet; Take 1 tablet (5 mg total) by mouth daily.   LORazepam  (ATIVAN ) 0.5 MG tablet; Take 1 tablet (0.5 mg total) by mouth at bedtime as needed for anxiety.

## 2024-08-20 DIAGNOSIS — S82032D Displaced transverse fracture of left patella, subsequent encounter for closed fracture with routine healing: Secondary | ICD-10-CM | POA: Diagnosis not present

## 2024-08-23 ENCOUNTER — Encounter: Payer: Self-pay | Admitting: Physician Assistant

## 2024-08-24 ENCOUNTER — Other Ambulatory Visit: Payer: Self-pay | Admitting: Physician Assistant

## 2024-08-24 ENCOUNTER — Other Ambulatory Visit

## 2024-08-24 ENCOUNTER — Other Ambulatory Visit: Payer: Self-pay

## 2024-08-24 MED ORDER — PANTOPRAZOLE SODIUM 40 MG PO TBEC: 40.0000 mg | DELAYED_RELEASE_TABLET | Freq: Two times a day (BID) | ORAL | 1 refills | Status: DC

## 2024-08-27 ENCOUNTER — Other Ambulatory Visit

## 2024-08-27 DIAGNOSIS — I7143 Infrarenal abdominal aortic aneurysm, without rupture: Secondary | ICD-10-CM | POA: Diagnosis not present

## 2024-08-27 DIAGNOSIS — I2699 Other pulmonary embolism without acute cor pulmonale: Secondary | ICD-10-CM | POA: Diagnosis not present

## 2024-08-27 DIAGNOSIS — I10 Essential (primary) hypertension: Secondary | ICD-10-CM | POA: Diagnosis not present

## 2024-08-27 DIAGNOSIS — I2510 Atherosclerotic heart disease of native coronary artery without angina pectoris: Secondary | ICD-10-CM | POA: Diagnosis not present

## 2024-08-27 DIAGNOSIS — S82042D Displaced comminuted fracture of left patella, subsequent encounter for closed fracture with routine healing: Secondary | ICD-10-CM | POA: Diagnosis not present

## 2024-08-27 DIAGNOSIS — I350 Nonrheumatic aortic (valve) stenosis: Secondary | ICD-10-CM | POA: Diagnosis not present

## 2024-09-01 ENCOUNTER — Ambulatory Visit (INDEPENDENT_AMBULATORY_CARE_PROVIDER_SITE_OTHER): Admitting: Physician Assistant

## 2024-09-01 ENCOUNTER — Encounter: Payer: Self-pay | Admitting: Physician Assistant

## 2024-09-01 VITALS — BP 112/60 | HR 87 | Temp 97.5°F | Ht 71.0 in | Wt 235.1 lb

## 2024-09-01 DIAGNOSIS — I350 Nonrheumatic aortic (valve) stenosis: Secondary | ICD-10-CM | POA: Diagnosis not present

## 2024-09-01 DIAGNOSIS — I2699 Other pulmonary embolism without acute cor pulmonale: Secondary | ICD-10-CM | POA: Diagnosis not present

## 2024-09-01 DIAGNOSIS — F419 Anxiety disorder, unspecified: Secondary | ICD-10-CM

## 2024-09-01 DIAGNOSIS — S82042D Displaced comminuted fracture of left patella, subsequent encounter for closed fracture with routine healing: Secondary | ICD-10-CM | POA: Diagnosis not present

## 2024-09-01 DIAGNOSIS — I2694 Multiple subsegmental pulmonary emboli without acute cor pulmonale: Secondary | ICD-10-CM

## 2024-09-01 DIAGNOSIS — D508 Other iron deficiency anemias: Secondary | ICD-10-CM | POA: Diagnosis not present

## 2024-09-01 DIAGNOSIS — I2510 Atherosclerotic heart disease of native coronary artery without angina pectoris: Secondary | ICD-10-CM | POA: Diagnosis not present

## 2024-09-01 DIAGNOSIS — I10 Essential (primary) hypertension: Secondary | ICD-10-CM

## 2024-09-01 DIAGNOSIS — I7143 Infrarenal abdominal aortic aneurysm, without rupture: Secondary | ICD-10-CM | POA: Diagnosis not present

## 2024-09-01 MED ORDER — BUSPIRONE HCL 5 MG PO TABS: 5.0000 mg | ORAL_TABLET | Freq: Two times a day (BID) | ORAL | 2 refills | Status: DC

## 2024-09-01 NOTE — Progress Notes (Signed)
 Subjective:  Patient ID: Angel Orr, male    DOB: 1951-01-25  Age: 73 y.o. MRN: [id number]  Chief Complaint  Patient presents with   Medical Management of Chronic Issues    HPI Pt in for follow up of PE - he is taking eliquis 5mg  bid  - would like to see if he can get patient assistance for financial help to get medication -  Is not having any issues taking the medication  Pt was concerned that bp may be slightly elevated - bp today actually good at 112/60 - denies chest pain/dyspnea  Pt was started on buspar by another provider a few weeks ago for anxiety.  He is only taking it once daily - think he would benefit more from bid dosing. He is also on zoloft  and ativan   Pt with iron def anemia - he did require transfusion while in hospital last month - his last hgb was slightly low and he is due to repeat labwork - He was given hemoccult cards to do but has not completed them      09/01/2024    1:42 PM 05/22/2024   10:37 AM 01/28/2024    8:05 AM 07/26/2023    8:09 AM 01/18/2023   10:18 AM  Depression screen PHQ 2/9  Decreased Interest 0 0 0 0 0  Down, Depressed, Hopeless 0 1 0  0  PHQ - 2 Score 0 1 0 0 0  Altered sleeping 0 0 0 0 0  Tired, decreased energy 0 1 0 0 0  Change in appetite 0 0 0 0 0  Feeling bad or failure about yourself  0 0 0 0 0  Trouble concentrating 0 0 0 0   Moving slowly or fidgety/restless 0 0 0 0 0  Suicidal thoughts 0 0 0 0 0  PHQ-9 Score 0 2  0  0  0   Difficult doing work/chores Not difficult at all Not difficult at all Not difficult at all Not difficult at all Not difficult at all     Data saved with a previous flowsheet row definition        01/18/2023   10:18 AM 07/26/2023    8:09 AM 01/28/2024    8:05 AM 05/22/2024   10:05 AM 09/01/2024    1:41 PM  Fall Risk  Falls in the past year? 0 0 0 0 0  Was there an injury with Fall? 0 0 0 0 0  Fall Risk Category Calculator 0 0 0 0 0  Patient at Risk for Falls Due to No Fall Risks No Fall Risks No Fall  Risks No Fall Risks No Fall Risks  Fall risk Follow up Falls evaluation completed Falls evaluation completed Falls evaluation completed Falls evaluation completed Falls evaluation completed     ROS CONSTITUTIONAL: Negative for chills, fatigue, fever, E/N/T: Negative for ear pain, nasal congestion and sore throat.  CARDIOVASCULAR: Negative for chest pain, dizziness, palpitations and pedal edema.  RESPIRATORY: Negative for recent cough and dyspnea.   PSYCHIATRIC: see HPI   Current Outpatient Medications:    amLODipine (NORVASC) 10 MG tablet, Take 10 mg by mouth daily., Disp: , Rfl:    apixaban (ELIQUIS) 5 MG TABS tablet, Take 1 tablet (5 mg total) by mouth 2 (two) times daily., Disp: 180 tablet, Rfl: 1   atorvastatin  (LIPITOR) 80 MG tablet, TAKE ONE TABLET BY MOUTH ONCE DAILY, Disp: 90 tablet, Rfl: 1   busPIRone (BUSPAR) 5 MG tablet, Take 1 tablet (5 mg total)  by mouth 2 (two) times daily., Disp: 60 tablet, Rfl: 2   Ferrous Sulfate Dried (FEOSOL) 200 (65 Fe) MG TABS, Take 1 tablet by mouth daily with breakfast., Disp: , Rfl:    loratadine (CLARITIN) 10 MG tablet, Take 10 mg by mouth daily., Disp: , Rfl:    LORazepam  (ATIVAN ) 0.5 MG tablet, Take 1 tablet (0.5 mg total) by mouth at bedtime as needed for anxiety., Disp: 30 tablet, Rfl: 0   Multiple Vitamins-Minerals (MULTI COMPLETE PO), Take 1 tablet by mouth daily., Disp: , Rfl:    nitroGLYCERIN  (NITROSTAT ) 0.4 MG SL tablet, Place 1 tablet (0.4 mg total) under the tongue every 5 (five) minutes as needed for chest pain., Disp: 25 tablet, Rfl: 1   pantoprazole (PROTONIX) 40 MG tablet, Take 1 tablet (40 mg total) by mouth 2 (two) times daily., Disp: 60 tablet, Rfl: 1   sertraline  (ZOLOFT ) 100 MG tablet, Take 1 tablet (100 mg total) by mouth daily., Disp: 90 tablet, Rfl: 1  Past Medical History:  Diagnosis Date   Aortic stenosis 12/19/2021   Chronic GERD    Coronary artery disease multiple borderline lesion by coronary CT angio by fractional  flow reserve negative 07/20/2022   Depression    Hyperlipemia    Major depressive disorder, single episode, moderate (HCC)    Objective:  PHYSICAL EXAM:   BP 112/60   Pulse 87   Temp (!) 97.5 F (36.4 C)   Ht 5' 11 (1.803 m)   Wt 235 lb 1.8 oz (106.6 kg)   SpO2 97%   BMI 32.79 kg/m    GEN: Well nourished, well developed, in no acute distress  Cardiac: RRR; no murmurs, rubs, or gallops,no edema  Respiratory:  normal respiratory rate and pattern with no distress - normal breath sounds with no rales, rhonchi, wheezes or rubs Skin: warm and dry, no rash  Neuro:  Alert and Oriented x 3, - CN II-Xii grossly intact Psych: euthymic mood, appropriate affect and demeanor  Assessment & Plan:    Other iron deficiency anemia -     Fe+CBC/D/Plt+TIBC+Fer+Retic Pt to complete hemoccult cards 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  Dispense: 60 tablet; Refill: 2 Continue zoloft  qd and ativan  prn    Follow-up: Return in about 2 months (around 11/01/2024) for follow-up.  An After Visit Summary was printed and given to the patient.  [name] [facility] [phone]

## 2024-09-01 NOTE — Addendum Note (Signed)
 Addended by: NICHOLAUS CREDIT on: 09/01/2024 02:29 PM   Modules accepted: Orders

## 2024-09-02 ENCOUNTER — Ambulatory Visit: Payer: Self-pay | Admitting: Family Medicine

## 2024-09-02 LAB — FE+CBC/D/PLT+TIBC+FER+RETIC
Basophils Absolute: 0.1 x10E3/uL (ref 0.0–0.2)
Basos: 1 %
EOS (ABSOLUTE): 0.2 x10E3/uL (ref 0.0–0.4)
Eos: 3 %
Ferritin: 52 ng/mL (ref 30–400)
Hematocrit: 36.2 % — ABNORMAL LOW (ref 37.5–51.0)
Hemoglobin: 11.3 g/dL — ABNORMAL LOW (ref 13.0–17.7)
Immature Grans (Abs): 0 x10E3/uL (ref 0.0–0.1)
Immature Granulocytes: 0 %
Iron Saturation: 38 % (ref 15–55)
Iron: 134 ug/dL (ref 38–169)
Lymphocytes Absolute: 1.6 x10E3/uL (ref 0.7–3.1)
Lymphs: 25 %
MCH: 27.4 pg (ref 26.6–33.0)
MCHC: 31.2 g/dL — ABNORMAL LOW (ref 31.5–35.7)
MCV: 88 fL (ref 79–97)
Monocytes Absolute: 0.6 x10E3/uL (ref 0.1–0.9)
Monocytes: 10 %
Neutrophils Absolute: 4.1 x10E3/uL (ref 1.4–7.0)
Neutrophils: 61 %
Platelets: 271 x10E3/uL (ref 150–450)
RBC: 4.12 x10E6/uL — ABNORMAL LOW (ref 4.14–5.80)
RDW: 17 % — ABNORMAL HIGH (ref 11.6–15.4)
Retic Ct Pct: 1.4 % (ref 0.6–2.6)
Total Iron Binding Capacity: 353 ug/dL (ref 250–450)
UIBC: 219 ug/dL (ref 111–343)
WBC: 6.7 x10E3/uL (ref 3.4–10.8)

## 2024-09-04 ENCOUNTER — Ambulatory Visit: Admitting: Physician Assistant

## 2024-09-08 ENCOUNTER — Telehealth: Payer: Self-pay

## 2024-09-08 DIAGNOSIS — I10 Essential (primary) hypertension: Secondary | ICD-10-CM | POA: Diagnosis not present

## 2024-09-08 DIAGNOSIS — S82042D Displaced comminuted fracture of left patella, subsequent encounter for closed fracture with routine healing: Secondary | ICD-10-CM | POA: Diagnosis not present

## 2024-09-08 DIAGNOSIS — I2510 Atherosclerotic heart disease of native coronary artery without angina pectoris: Secondary | ICD-10-CM | POA: Diagnosis not present

## 2024-09-08 DIAGNOSIS — I350 Nonrheumatic aortic (valve) stenosis: Secondary | ICD-10-CM | POA: Diagnosis not present

## 2024-09-08 DIAGNOSIS — I7143 Infrarenal abdominal aortic aneurysm, without rupture: Secondary | ICD-10-CM | POA: Diagnosis not present

## 2024-09-08 DIAGNOSIS — I2699 Other pulmonary embolism without acute cor pulmonale: Secondary | ICD-10-CM | POA: Diagnosis not present

## 2024-09-08 NOTE — Progress Notes (Signed)
 Complex Care Management Note  Care Guide Note 09/08/2024 Name: Angel Orr MRN: [id number] DOB: May 12, 1951  Angel Orr is a 73 y.o. year old male who sees Nicholaus Credit, PA-C for primary care. I reached out to Vicenta Sayres by phone today to offer complex care management services.  [name] was given information about Complex Care Management services today including:   The Complex Care Management services include support from the care team which includes your Nurse Care Manager, Clinical Social Worker, or Pharmacist.  The Complex Care Management team is here to help remove barriers to the health concerns and goals most important to you. Complex Care Management services are voluntary, and the patient may decline or stop services at any time by request to their care team member.   Complex Care Management Consent Status: Patient agreed to services and verbal consent obtained.   Follow up plan:  Telephone appointment with complex care management team member scheduled for:  09/16/24 at 1:00 p.m.   Encounter Outcome:  Patient Scheduled  Dreama [facility]  [facility], [facility] VBCI Assistant Direct Dial: [phone]  Fax: [phone]

## 2024-09-08 NOTE — Progress Notes (Signed)
 Complex Care Management Note Care Guide Note  09/08/2024 Name: Angel Orr MRN: [id number] DOB: Dec 03, 1950   Complex Care Management Outreach Attempts: An unsuccessful telephone outreach was attempted today to offer the patient information about available complex care management services.  Follow Up Plan:  Additional outreach attempts will be made to offer the patient complex care management information and services.   Encounter Outcome:  No Answer  Dreama [facility]  [facility], [facility] VBCI Assistant Direct Dial: [phone]  Fax: [phone]

## 2024-09-09 DIAGNOSIS — S82032D Displaced transverse fracture of left patella, subsequent encounter for closed fracture with routine healing: Secondary | ICD-10-CM | POA: Diagnosis not present

## 2024-09-14 ENCOUNTER — Ambulatory Visit: Admitting: Physician Assistant

## 2024-09-14 ENCOUNTER — Ambulatory Visit

## 2024-09-14 DIAGNOSIS — K922 Gastrointestinal hemorrhage, unspecified: Secondary | ICD-10-CM

## 2024-09-15 ENCOUNTER — Ambulatory Visit: Payer: Self-pay | Admitting: Physician Assistant

## 2024-09-15 LAB — POC HEMOCCULT BLD/STL (HOME/3-CARD/SCREEN)
Card #2 Fecal Occult Blod, POC: NEGATIVE
Card #3 Fecal Occult Blood, POC: NEGATIVE
Fecal Occult Blood, POC: NEGATIVE

## 2024-09-15 NOTE — Progress Notes (Signed)
 Patient is in office today for a nurse visit for Hemoccult Card. Patient Hemoccult Card results were sent to provider.

## 2024-09-16 ENCOUNTER — Telehealth: Payer: Self-pay

## 2024-09-16 ENCOUNTER — Other Ambulatory Visit

## 2024-09-16 NOTE — Progress Notes (Deleted)
   09/16/2024 Name: Angel Orr MRN: [id number] DOB: 25-Jul-1951  No chief complaint on file.   {Visit Ubez:73349}   Subjective:  Care Team: Primary Care Provider: Nicholaus Camie RIGGERS ; Next Scheduled Visit: *** {careteamprovider:27366}  Medication Access/Adherence  Current Pharmacy:  [facility], [place] - [address] [place] [address] Phone: [phone] Fax: [phone]  [facility] [address] [place] - [address] [place] [address] Phone: [phone] Fax: [phone]   Patient reports affordability concerns with their medications: {YES/NO:21197} Patient reports access/transportation concerns to their pharmacy: {YES/NO:21197} Patient reports adherence concerns with their medications:  {YES/NO:21197} ***   {Pharmacy S/O Choices:26420}   Objective:  No results found for: HGBA1C  Lab Results  Component Value Date   CREATININE 1.21 08/13/2024   BUN 20 08/13/2024   NA 140 08/13/2024   K 4.4 08/13/2024   CL 103 08/13/2024   CO2 21 08/13/2024    Lab Results  Component Value Date   CHOL 160 01/28/2024   HDL 63 01/28/2024   LDLCALC 72 01/28/2024   TRIG 145 01/28/2024   CHOLHDL 2.5 01/28/2024    Medications Reviewed Today   Medications were not reviewed in this encounter       Assessment/Plan:   {Pharmacy A/P Choices:26421}  Follow Up Plan: ***  ***

## 2024-09-16 NOTE — Progress Notes (Signed)
   09/16/2024  Patient ID: Angel Orr, male   DOB: 03-16-1951, 73 y.o.   MRN: [id number]  Attempted to contact patient for scheduled appointment for medication management. Left HIPAA compliant message for patient to return my call at their convenience.   Left message with my callback number.   Lang Sieve, PharmD, BCGP Clinical Pharmacist  [phone]

## 2024-09-18 ENCOUNTER — Telehealth: Payer: Self-pay

## 2024-09-18 NOTE — Progress Notes (Signed)
   09/18/2024 Name: [name] MRN: [id number] DOB: 02-Jan-1951  Chief Complaint  Patient presents with   Medication Assistance   Spoke with Corean to review financial implications of eliquis  for remainder of 2025 and for 2026. Discussed the following:  [employer]: Eliquis  - covered under plan, tier 3, 2025: Copay~$206.78/90 mail Copay~$74/30 days  Rx Deductible $590, has met ~$490 with about ~$90 remaining this year    Patient will be transferring to [facility] plan but I did obtain [facility] info for next year on Eliquis  - covered under plan, tier 3, 2026: $615 Rx deductible tiers 3-6  Possible new $231 list price for 2026 per CMS.  25% coinsurance    Patient plans to switch to [facility] Advantage for 2026: expects total Rx drug cost to be $766, $250 of that being deductible and $47 copayment thereafter deductible is met.   Reviewed patient assistance requirements for eliquis .   Follow-up plan: patient would like patient section sent to house, might try to get by on samples for 2026 but understands current year costs associated with eliquis .  Encouraged to reach out to me with any questions whether this year or 2026.  ________________________________________ **Could also consider medicare extra help program as appropriate - can apply through [email or web address].gov  Qualifications: Income limit: $23,475  Resource limit: $17,600    Lang Sieve, PharmD, BCGP Clinical Pharmacist  [phone]

## 2024-09-20 ENCOUNTER — Encounter: Payer: Self-pay | Admitting: Physician Assistant

## 2024-09-25 ENCOUNTER — Telehealth: Payer: Self-pay | Admitting: Cardiology

## 2024-09-25 MED ORDER — APIXABAN 5 MG PO TABS: 5.0000 mg | ORAL_TABLET | Freq: Two times a day (BID) | ORAL | Status: AC

## 2024-09-25 NOTE — Telephone Encounter (Signed)
 PT is requesting to pick up samples of eliquis  5mg 

## 2024-09-25 NOTE — Telephone Encounter (Signed)
 Eliquis  samples #70 provided for pt

## 2024-10-16 ENCOUNTER — Other Ambulatory Visit: Payer: Self-pay | Admitting: Physician Assistant

## 2024-10-26 ENCOUNTER — Encounter: Payer: Self-pay | Admitting: Physician Assistant

## 2024-11-09 ENCOUNTER — Encounter: Payer: Self-pay | Admitting: Physician Assistant

## 2024-11-09 ENCOUNTER — Ambulatory Visit: Admitting: Physician Assistant

## 2024-11-09 VITALS — BP 122/68 | HR 89 | Temp 97.3°F | Resp 16 | Ht 71.0 in | Wt 222.0 lb

## 2024-11-09 DIAGNOSIS — F321 Major depressive disorder, single episode, moderate: Secondary | ICD-10-CM

## 2024-11-09 DIAGNOSIS — I7143 Infrarenal abdominal aortic aneurysm, without rupture: Secondary | ICD-10-CM | POA: Diagnosis not present

## 2024-11-09 DIAGNOSIS — F418 Other specified anxiety disorders: Secondary | ICD-10-CM

## 2024-11-09 DIAGNOSIS — I10 Essential (primary) hypertension: Secondary | ICD-10-CM

## 2024-11-09 DIAGNOSIS — F419 Anxiety disorder, unspecified: Secondary | ICD-10-CM | POA: Diagnosis not present

## 2024-11-09 DIAGNOSIS — D508 Other iron deficiency anemias: Secondary | ICD-10-CM

## 2024-11-09 MED ORDER — BUSPIRONE HCL 5 MG PO TABS: 5.0000 mg | ORAL_TABLET | Freq: Two times a day (BID) | ORAL | 1 refills | Status: AC

## 2024-11-09 MED ORDER — AMLODIPINE BESYLATE 5 MG PO TABS: 5.0000 mg | ORAL_TABLET | Freq: Every day | ORAL | Status: DC

## 2024-11-09 MED ORDER — LORAZEPAM 0.5 MG PO TABS: 0.5000 mg | ORAL_TABLET | Freq: Every evening | ORAL | 0 refills | Status: AC | PRN

## 2024-11-09 MED ORDER — AMLODIPINE BESYLATE 5 MG PO TABS: 5.0000 mg | ORAL_TABLET | Freq: Every day | ORAL | 1 refills | Status: AC

## 2024-11-09 MED ORDER — SERTRALINE HCL 100 MG PO TABS: 100.0000 mg | ORAL_TABLET | Freq: Every day | ORAL | 1 refills | Status: AC

## 2024-11-09 NOTE — Progress Notes (Signed)
 "  Subjective:  Patient ID: Angel Orr, male    DOB: 31-Oct-1950  Age: 74 y.o. MRN: [id number]  Chief Complaint  Patient presents with   Anemia    Anemia   Pt in for follow up of PE - he is taking eliquis  5mg  bid  - currently having no symptoms of chest pain or dyspnea Is not having any issues taking the medication  Pt with hypertension - he is currently on norvasc  5mg  qd and doing well BP today at 122/68  Pt with depression and anxiety - states symptoms are stable on zoloft  100mg  qd and buspar  5mg  bid.  He does use ativan  as needed.  At times has trouble sleeping but denies other problems  Pt with iron def anemia - he did require transfusion while in hospital several months ago - his last hgb was stable and recent hemoccult cards negative - he is due to recheck labwork Pt is taking daily iron supplement  Pt was found to have infrarenal AAA on CT scan 3 years ago - is due for ultrasound to monitor stability      09/01/2024    1:42 PM 05/22/2024   10:37 AM 01/28/2024    8:05 AM 07/26/2023    8:09 AM 01/18/2023   10:18 AM  Depression screen PHQ 2/9  Decreased Interest 0 0 0 0 0  Down, Depressed, Hopeless 0 1 0  0  PHQ - 2 Score 0 1 0 0 0  Altered sleeping 0 0 0 0 0  Tired, decreased energy 0 1 0 0 0  Change in appetite 0 0 0 0 0  Feeling bad or failure about yourself  0 0 0 0 0  Trouble concentrating 0 0 0 0   Moving slowly or fidgety/restless 0 0 0 0 0  Suicidal thoughts 0 0 0 0 0  PHQ-9 Score 0 2  0  0  0   Difficult doing work/chores Not difficult at all Not difficult at all Not difficult at all Not difficult at all Not difficult at all     Data saved with a previous flowsheet row definition        01/18/2023   10:18 AM 07/26/2023    8:09 AM 01/28/2024    8:05 AM 05/22/2024   10:05 AM 09/01/2024    1:41 PM  Fall Risk  Falls in the past year? 0 0 0 0 0  Was there an injury with Fall? 0  0  0  0  0   Fall Risk Category Calculator 0 0 0 0 0  Patient at Risk for Falls  Due to No Fall Risks No Fall Risks No Fall Risks No Fall Risks No Fall Risks  Fall risk Follow up Falls evaluation completed Falls evaluation completed Falls evaluation completed Falls evaluation completed Falls evaluation completed     Data saved with a previous flowsheet row definition    CONSTITUTIONAL: Negative for chills, fatigue, fever,  E/N/T: Negative for ear pain, nasal congestion and sore throat.  CARDIOVASCULAR: Negative for chest pain, dizziness, palpitations and pedal edema.  RESPIRATORY: Negative for recent cough and dyspnea.  GASTROINTESTINAL: Negative for abdominal pain, acid reflux symptoms, constipation, diarrhea, nausea and vomiting.  INTEGUMENTARY: Negative for rash.  PSYCHIATRIC: Negative for sleep disturbance and to question depression screen.  Negative for depression, negative for anhedonia.        Current Outpatient Medications:    apixaban  (ELIQUIS ) 5 MG TABS tablet, Take 1 tablet (5 mg total) by mouth  2 (two) times daily., Disp: , Rfl:    atorvastatin  (LIPITOR) 80 MG tablet, TAKE ONE TABLET BY MOUTH ONCE DAILY, Disp: 90 tablet, Rfl: 1   Ferrous Sulfate Dried (FEOSOL) 200 (65 Fe) MG TABS, Take 1 tablet by mouth daily with breakfast., Disp: , Rfl:    Multiple Vitamins-Minerals (MULTI COMPLETE PO), Take 1 tablet by mouth daily., Disp: , Rfl:    nitroGLYCERIN  (NITROSTAT ) 0.4 MG SL tablet, Place 1 tablet (0.4 mg total) under the tongue every 5 (five) minutes as needed for chest pain., Disp: 25 tablet, Rfl: 1   pantoprazole  (PROTONIX ) 40 MG tablet, TAKE ONE TABLET BY MOUTH TWICE DAILY, Disp: 60 tablet, Rfl: 1   amLODipine  (NORVASC ) 5 MG tablet, Take 1 tablet (5 mg total) by mouth daily., Disp: 90 tablet, Rfl: 1   busPIRone  (BUSPAR ) 5 MG tablet, Take 1 tablet (5 mg total) by mouth 2 (two) times daily., Disp: 180 tablet, Rfl: 1   LORazepam  (ATIVAN ) 0.5 MG tablet, Take 1 tablet (0.5 mg total) by mouth at bedtime as needed for anxiety., Disp: 30 tablet, Rfl: 0   sertraline   (ZOLOFT ) 100 MG tablet, Take 1 tablet (100 mg total) by mouth daily., Disp: 90 tablet, Rfl: 1  Past Medical History:  Diagnosis Date   Aortic stenosis 12/19/2021   Chronic GERD    Coronary artery disease multiple borderline lesion by coronary CT angio by fractional flow reserve negative 07/20/2022   Depression    Hyperlipemia    Major depressive disorder, single episode, moderate (HCC)    Objective:  PHYSICAL EXAM:   VS: BP 122/68   Pulse 89   Temp (!) 97.3 F (36.3 C) (Temporal)   Resp 16   Ht 5' 11 (1.803 m)   Wt 222 lb (100.7 kg)   SpO2 99%   BMI 30.96 kg/m   GEN: Well nourished, well developed, in no acute distress  Cardiac: RRR; no murmurs, rubs, or gallops,no edema - Respiratory:  normal respiratory rate and pattern with no distress - normal breath sounds with no rales, rhonchi, wheezes or rubs MS: no deformity or atrophy  Skin: warm and dry, no rash  Neuro:  Alert and Oriented x 3, - CN II-Xii grossly intact Psych: euthymic mood, appropriate affect and demeanor   Assessment & Plan:    Other iron deficiency anemia -     Fe+CBC/D/Plt+TIBC+Fer+Retic Continue iron supplements 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  An After Visit Summary was printed and given to the patient.  [name] [facility] [phone] "

## 2024-11-10 ENCOUNTER — Other Ambulatory Visit: Payer: Self-pay | Admitting: Physician Assistant

## 2024-11-10 ENCOUNTER — Ambulatory Visit: Payer: Self-pay | Admitting: Physician Assistant

## 2024-11-10 DIAGNOSIS — R790 Abnormal level of blood mineral: Secondary | ICD-10-CM

## 2024-11-10 DIAGNOSIS — R899 Unspecified abnormal finding in specimens from other organs, systems and tissues: Secondary | ICD-10-CM

## 2024-11-10 LAB — COMPREHENSIVE METABOLIC PANEL WITH GFR
ALT: 22 IU/L (ref 0–44)
AST: 26 IU/L (ref 0–40)
Albumin: 4 g/dL (ref 3.8–4.8)
Alkaline Phosphatase: 78 IU/L (ref 47–123)
BUN/Creatinine Ratio: 11 (ref 10–24)
BUN: 17 mg/dL (ref 8–27)
Bilirubin Total: 0.3 mg/dL (ref 0.0–1.2)
CO2: 22 mmol/L (ref 20–29)
Calcium: 8.8 mg/dL (ref 8.6–10.2)
Chloride: 106 mmol/L (ref 96–106)
Creatinine, Ser: 1.5 mg/dL — ABNORMAL HIGH (ref 0.76–1.27)
Globulin, Total: 2.6 g/dL (ref 1.5–4.5)
Glucose: 88 mg/dL (ref 70–99)
Potassium: 5 mmol/L (ref 3.5–5.2)
Sodium: 143 mmol/L (ref 134–144)
Total Protein: 6.6 g/dL (ref 6.0–8.5)
eGFR: 49 mL/min/1.73 — ABNORMAL LOW

## 2024-11-10 LAB — FE+CBC/D/PLT+TIBC+FER+RETIC
Basophils Absolute: 0 x10E3/uL (ref 0.0–0.2)
Basos: 1 %
EOS (ABSOLUTE): 0.2 x10E3/uL (ref 0.0–0.4)
Eos: 3 %
Ferritin: 28 ng/mL — ABNORMAL LOW (ref 30–400)
Hematocrit: 32 % — ABNORMAL LOW (ref 37.5–51.0)
Hemoglobin: 9.8 g/dL — ABNORMAL LOW (ref 13.0–17.7)
Immature Grans (Abs): 0 x10E3/uL (ref 0.0–0.1)
Immature Granulocytes: 0 %
Iron Saturation: 78 % (ref 15–55)
Iron: 274 ug/dL (ref 38–169)
Lymphocytes Absolute: 1.4 x10E3/uL (ref 0.7–3.1)
Lymphs: 20 %
MCH: 28.2 pg (ref 26.6–33.0)
MCHC: 30.6 g/dL — ABNORMAL LOW (ref 31.5–35.7)
MCV: 92 fL (ref 79–97)
Monocytes Absolute: 0.7 x10E3/uL (ref 0.1–0.9)
Monocytes: 10 %
Neutrophils Absolute: 4.6 x10E3/uL (ref 1.4–7.0)
Neutrophils: 66 %
Platelets: 265 x10E3/uL (ref 150–450)
RBC: 3.47 x10E6/uL — ABNORMAL LOW (ref 4.14–5.80)
RDW: 15 % (ref 11.6–15.4)
Retic Ct Pct: 2.4 % (ref 0.6–2.6)
Total Iron Binding Capacity: 351 ug/dL (ref 250–450)
UIBC: 77 ug/dL — ABNORMAL LOW (ref 111–343)
WBC: 6.9 x10E3/uL (ref 3.4–10.8)

## 2024-11-11 ENCOUNTER — Encounter: Payer: Self-pay | Admitting: Physician Assistant

## 2024-11-17 ENCOUNTER — Other Ambulatory Visit (HOSPITAL_BASED_OUTPATIENT_CLINIC_OR_DEPARTMENT_OTHER): Admitting: Radiology

## 2024-11-20 ENCOUNTER — Other Ambulatory Visit: Payer: Self-pay | Admitting: Family Medicine

## 2024-11-20 ENCOUNTER — Other Ambulatory Visit: Payer: Self-pay | Admitting: Physician Assistant

## 2024-11-20 DIAGNOSIS — M79621 Pain in right upper arm: Secondary | ICD-10-CM

## 2024-11-23 ENCOUNTER — Other Ambulatory Visit

## 2024-11-26 ENCOUNTER — Ambulatory Visit (HOSPITAL_BASED_OUTPATIENT_CLINIC_OR_DEPARTMENT_OTHER)
Admission: RE | Admit: 2024-11-26 | Discharge: 2024-11-26 | Disposition: A | Source: Ambulatory Visit | Attending: Physician Assistant | Admitting: Physician Assistant

## 2024-11-26 ENCOUNTER — Other Ambulatory Visit

## 2024-11-26 DIAGNOSIS — I7143 Infrarenal abdominal aortic aneurysm, without rupture: Secondary | ICD-10-CM

## 2024-11-26 DIAGNOSIS — R790 Abnormal level of blood mineral: Secondary | ICD-10-CM

## 2024-11-26 DIAGNOSIS — R899 Unspecified abnormal finding in specimens from other organs, systems and tissues: Secondary | ICD-10-CM

## 2025-02-08 ENCOUNTER — Ambulatory Visit: Admitting: Physician Assistant
